# Patient Record
Sex: Male | Born: 1965 | Race: White | Hispanic: No | Smoking: Never smoker
Health system: Southern US, Community
[De-identification: ages and names within clinical notes are randomized; demographics above are authoritative.]

## PROBLEM LIST (undated history)

## (undated) DIAGNOSIS — I509 Heart failure, unspecified: Secondary | ICD-10-CM

## (undated) DIAGNOSIS — E119 Type 2 diabetes mellitus without complications: Secondary | ICD-10-CM

## (undated) DIAGNOSIS — I890 Lymphedema, not elsewhere classified: Secondary | ICD-10-CM

## (undated) DIAGNOSIS — G629 Polyneuropathy, unspecified: Secondary | ICD-10-CM

## (undated) DIAGNOSIS — I10 Essential (primary) hypertension: Secondary | ICD-10-CM

## (undated) DIAGNOSIS — L0390 Cellulitis, unspecified: Secondary | ICD-10-CM

---

## 2009-09-01 ENCOUNTER — Emergency Department (HOSPITAL_COMMUNITY): Admission: EM | Admit: 2009-09-01 | Discharge: 2009-09-01 | Payer: Self-pay | Admitting: Emergency Medicine

## 2009-09-02 ENCOUNTER — Emergency Department (HOSPITAL_COMMUNITY): Admission: EM | Admit: 2009-09-02 | Discharge: 2009-09-02 | Payer: Self-pay | Admitting: Emergency Medicine

## 2011-04-08 ENCOUNTER — Encounter (HOSPITAL_COMMUNITY): Payer: Self-pay

## 2011-04-08 ENCOUNTER — Emergency Department (HOSPITAL_COMMUNITY)
Admission: EM | Admit: 2011-04-08 | Discharge: 2011-04-08 | Disposition: A | Discharge status: Home / Self Care | Payer: Self-pay | Attending: Emergency Medicine | Admitting: Emergency Medicine

## 2011-04-08 ENCOUNTER — Emergency Department (HOSPITAL_COMMUNITY): Payer: Self-pay

## 2011-04-08 DIAGNOSIS — R609 Edema, unspecified: Secondary | ICD-10-CM | POA: Insufficient documentation

## 2011-04-08 DIAGNOSIS — L0390 Cellulitis, unspecified: Secondary | ICD-10-CM

## 2011-04-08 DIAGNOSIS — L02419 Cutaneous abscess of limb, unspecified: Secondary | ICD-10-CM | POA: Insufficient documentation

## 2011-04-08 DIAGNOSIS — R600 Localized edema: Secondary | ICD-10-CM

## 2011-04-08 DIAGNOSIS — S91009A Unspecified open wound, unspecified ankle, initial encounter: Secondary | ICD-10-CM | POA: Insufficient documentation

## 2011-04-08 DIAGNOSIS — S81802A Unspecified open wound, left lower leg, initial encounter: Secondary | ICD-10-CM

## 2011-04-08 DIAGNOSIS — S81009A Unspecified open wound, unspecified knee, initial encounter: Secondary | ICD-10-CM | POA: Insufficient documentation

## 2011-04-08 DIAGNOSIS — IMO0002 Reserved for concepts with insufficient information to code with codable children: Secondary | ICD-10-CM | POA: Insufficient documentation

## 2011-04-08 LAB — CBC
HCT: 45.9 % (ref 39.0–52.0)
Hemoglobin: 15.2 g/dL (ref 13.0–17.0)
MCHC: 33.1 g/dL (ref 30.0–36.0)
MCV: 93.1 fL (ref 78.0–100.0)
Platelets: 226 10*3/uL (ref 150–400)
RDW: 13.3 % (ref 11.5–15.5)
WBC: 9.6 10*3/uL (ref 4.0–10.5)

## 2011-04-08 LAB — DIFFERENTIAL
Basophils Absolute: 0 10*3/uL (ref 0.0–0.1)
Basophils Relative: 0 % (ref 0–1)
Eosinophils Absolute: 0.3 10*3/uL (ref 0.0–0.7)
Eosinophils Relative: 3 % (ref 0–5)
Lymphs Abs: 1.9 10*3/uL (ref 0.7–4.0)
Monocytes Absolute: 0.7 10*3/uL (ref 0.1–1.0)
Monocytes Relative: 8 % (ref 3–12)
Neutro Abs: 6.7 10*3/uL (ref 1.7–7.7)
Neutrophils Relative %: 70 % (ref 43–77)

## 2011-04-08 LAB — COMPREHENSIVE METABOLIC PANEL
ALT: 19 U/L (ref 0–53)
AST: 15 U/L (ref 0–37)
Albumin: 3.6 g/dL (ref 3.5–5.2)
Alkaline Phosphatase: 102 U/L (ref 39–117)
BUN: 12 mg/dL (ref 6–23)
Calcium: 10.1 mg/dL (ref 8.4–10.5)
Chloride: 97 mEq/L (ref 96–112)
Glucose, Bld: 100 mg/dL — ABNORMAL HIGH (ref 70–99)

## 2011-04-08 MED ORDER — DOXYCYCLINE HYCLATE 100 MG PO CAPS: 100.0000 mg | ORAL_CAPSULE | Freq: Two times a day (BID) | ORAL | Status: AC

## 2011-04-08 MED ORDER — FUROSEMIDE 20 MG PO TABS: 20.0000 mg | ORAL_TABLET | Freq: Every day | ORAL | Status: DC

## 2011-04-08 MED ORDER — DOXYCYCLINE HYCLATE 100 MG PO TABS
100.0000 mg | ORAL_TABLET | Freq: Once | ORAL | Status: AC
  Administered 2011-04-08: 100 mg via ORAL
  Filled 2011-04-08: qty 1

## 2011-04-08 NOTE — ED Notes (Signed)
Patient is comfortable still has some concerns and questions. RN Kendal Hymen aware. Patient is sitting in the chair at bedside waiting for discharge paperwork.

## 2011-04-08 NOTE — ED Notes (Signed)
Pt. States swelling X 4 weeks. Wound on lower left leg X3 weeks.  Reports he hit it on the bottom of a tracker. States taking a diuretic X1 week but has stopped. Reports no loss of feeling in legs and ability to walk.  Pulses noted in both legs.

## 2011-04-08 NOTE — ED Provider Notes (Signed)
This chart was scribed for [employer]. Juan Branch, MD by Williemae Natter. The patient was seen in room APA04/AP[facility]04 at 9:00 AM.  CSN: [id number]  Arrival date & time 04/08/11  0830   First MD Initiated Contact with Patient 04/08/11 0831      No chief complaint on file.   (Consider location/radiation/quality/duration/timing/severity/associated sxs/prior treatment) Patient is a 46 y.o. male presenting with leg pain. The history is provided by the patient.  Leg Pain  The incident occurred more than 1 week ago. Injury mechanism: hit leg on tractor. The pain is present in the left leg. The pain is moderate. Associated symptoms comments: Swelling . He reports no foreign bodies present. He has tried nothing for the symptoms.   Juan Freeman is a 46 y.o. male who presents to the Emergency Department complaining of acute onset constant leg swelling for 4 weeks. Pt reported that he cut his leg on the bucket of a tractor 3 weeks ago. Pt was trying to let wound heal on its own.  Pt reports that strength in the affected extremity is normal and that he has never experienced swelling like this before. Swelling has not subsided since onset. No other associated signs or symptoms.   No past medical history on file.  No past surgical history on file.  No family history on file.  History  Substance Use Topics  . Smoking status: Not on file  . Smokeless tobacco: Not on file  . Alcohol Use: Not on file      Review of Systems 10 Systems reviewed and are negative for acute change except as noted in the HPI.  Allergies  Review of patient's allergies indicates not on file.  Home Medications  No current outpatient prescriptions on file.  There were no vitals taken for this visit.  Physical Exam  Nursing note and vitals reviewed. Constitutional: He is oriented to person, place, and time. He appears well-developed and well-nourished.  HENT:  Head: Normocephalic and atraumatic.  Neck: Normal range of  motion. Neck supple.  Cardiovascular: Normal rate, regular rhythm and normal heart sounds.   Pulmonary/Chest: Effort normal. No respiratory distress.       Few fine crackles at bases  Abdominal: Soft. There is no tenderness.       Abdomen is obese  Musculoskeletal: He exhibits edema.       3+ edema of lower extremities up to thigh Lesion on left leg 15x12 cm with scabbing, erythema, weeping, and induration  Dorsalis pedis are present on both feet  Neurological: He is alert and oriented to person, place, and time.  Skin: Skin is warm and dry.  Psychiatric: He has a normal mood and affect. His behavior is normal.    ED Course  Procedures (including critical care time) DIAGNOSTIC STUDIES: Oxygen Saturation is 98% on room air, normal by my interpretation.    COORDINATION OF CARE: Medications - No data to display   Results for orders placed during the hospital encounter of 04/08/11  CBC      Component Value Range   WBC 9.6  4.0 - 10.5 (K/uL)   RBC 4.93  4.22 - 5.81 (MIL/uL)   Hemoglobin 15.2  13.0 - 17.0 (g/dL)   HCT 40.9  81.1 - 91.4 (%)   MCV 93.1  78.0 - 100.0 (fL)   MCH 30.8  26.0 - 34.0 (pg)   MCHC 33.1  30.0 - 36.0 (g/dL)   RDW 78.2  95.6 - 21.3 (%)   Platelets 226  150 - 400 (K/uL)  DIFFERENTIAL      Component Value Range   Neutrophils Relative 70  43 - 77 (%)   Neutro Abs 6.7  1.7 - 7.7 (K/uL)   Lymphocytes Relative 20  12 - 46 (%)   Lymphs Abs 1.9  0.7 - 4.0 (K/uL)   Monocytes Relative 8  3 - 12 (%)   Monocytes Absolute 0.7  0.1 - 1.0 (K/uL)   Eosinophils Relative 3  0 - 5 (%)   Eosinophils Absolute 0.3  0.0 - 0.7 (K/uL)   Basophils Relative 0  0 - 1 (%)   Basophils Absolute 0.0  0.0 - 0.1 (K/uL)  COMPREHENSIVE METABOLIC PANEL      Component Value Range   Sodium 135  135 - 145 (mEq/L)   Potassium 3.9  3.5 - 5.1 (mEq/L)   Chloride 97  96 - 112 (mEq/L)   CO2 28  19 - 32 (mEq/L)   Glucose, Bld 100 (*) 70 - 99 (mg/dL)   BUN 12  6 - 23 (mg/dL)   Creatinine, Ser  3.47  0.50 - 1.35 (mg/dL)   Calcium 42.5  8.4 - 10.5 (mg/dL)   Total Protein 7.7  6.0 - 8.3 (g/dL)   Albumin 3.6  3.5 - 5.2 (g/dL)   AST 15  0 - 37 (U/L)   ALT 19  0 - 53 (U/L)   Alkaline Phosphatase 102  39 - 117 (U/L)   Total Bilirubin 0.5  0.3 - 1.2 (mg/dL)   GFR calc non Af Amer 86 (*) >90 (mL/min)   GFR calc Af Amer >90  >90 (mL/min)  PRO B NATRIURETIC PEPTIDE      Component Value Range   Pro B Natriuretic peptide (BNP) 39.2  0 - 125 (pg/mL)  Dg Chest 2 View  04/08/2011  *RADIOLOGY REPORT*  Clinical Data: Swelling, cough  CHEST - 2 VIEW  Comparison: None.  Findings: Cardiomediastinal silhouette is unremarkable.  No acute infiltrate or pleural effusion.  No pulmonary edema.  Bony thorax is unremarkable.  IMPRESSION: No active disease.  Original Report Authenticated By: Natasha Mead, M.D.   12:53 PM Recheck pt notified of lab results and scans. No pulmonary edema or congestion. Pt ready for discharge and advised to follow up with physical therapy. Prescribed an antibiotic.   MDM  Patient with poorly healing wound to his left lower leg with mild cellulitis. Labs are unremarkable. BNP negative.Reviewed results with patient. Referral has been made to PT for wound care. Initiated antibiotic therapy.Pt stable in ED with no significant deterioration in condition.The patient appears reasonably screened and/or stabilized for discharge and I doubt any other medical condition or other [facility] requiring further screening, evaluation, or treatment in the ED at this time prior to discharge.  I personally performed the services described in this documentation, which was scribed in my presence. The recorded information has been reviewed and considered.   MDM Reviewed: nursing note and vitals Interpretation: labs and x-ray           Nicoletta Dress. Juan Branch, MD 04/08/11 1319

## 2011-04-08 NOTE — Discharge Instructions (Signed)
Continuing your current care of your wound. Take the antibiotic as directed. Use the Lasix for the next 10 days. Physical therapy will be contacting you for an appointment for wound care. If you do not hear from them by Monday afternoon please call the physical therapy department here at [facility] . The telephone number is [phone].   Cellulitis Cellulitis is an infection of the skin and the tissue beneath it. The area is typically red and tender. It is caused by germs (bacteria) (usually staph or strep) that enter the body through cuts or sores. Cellulitis most commonly occurs in the arms or lower legs.  HOME CARE INSTRUCTIONS   If you are given a prescription for medications which kill germs (antibiotics), take as directed until finished.   If the infection is on the arm or leg, keep the limb elevated as able.   Use a warm cloth several times per day to relieve pain and encourage healing.   See your caregiver for recheck of the infected site as directed if problems arise.   Only take over-the-counter or prescription medicines for pain, discomfort, or fever as directed by your caregiver.  SEEK MEDICAL CARE IF:   The area of redness (inflammation) is spreading, there are red streaks coming from the infected site, or if a part of the infection begins to turn dark in color.   The joint or bone underneath the infected skin becomes painful after the skin has healed.   The infection returns in the same or another area after it seems to have gone away.   A boil or bump swells up. This may be an abscess.   New, unexplained problems such as pain or fever develop.  SEEK IMMEDIATE MEDICAL CARE IF:   You have a fever.   You or your child feels drowsy or lethargic.   There is vomiting, diarrhea, or lasting discomfort or feeling ill (malaise) with muscle aches and pains.  MAKE SURE YOU:   Understand these instructions.   Will watch your condition.   Will get help right away if you are not  doing well or get worse.  Document Released: 10/13/2004 Document Revised: 12/23/2010 Document Reviewed: 08/22/2007 [facility] Patient Information 2012 [place], [place].

## 2011-04-08 NOTE — ED Notes (Signed)
Pt has redness and swelling to both legs x 3 weeks. Pt also has 6 in x 4 in area on left lower leg with redness and scabbed area. No drainage noted. Pt states that he hit his left leg on a bucket on a tractor. States that it started healing and then he hit it on a dog house the next week. Pt alert and oriented  X 3. Skin warm and dry. Color pink. Pt lying on stretcher playing games on his phone.

## 2011-04-08 NOTE — ED Notes (Signed)
Patient was given a urinal at this time. Patient states he does not need anything else at this time.

## 2012-07-24 ENCOUNTER — Encounter (HOSPITAL_COMMUNITY): Payer: Self-pay | Admitting: *Deleted

## 2012-07-24 ENCOUNTER — Emergency Department (HOSPITAL_COMMUNITY): Payer: Self-pay

## 2012-07-24 ENCOUNTER — Inpatient Hospital Stay (HOSPITAL_COMMUNITY)
Admission: EM | Admit: 2012-07-24 | Discharge: 2012-08-06 | DRG: 602 | Disposition: A | Discharge status: Home / Self Care | Payer: MEDICAID | Attending: Internal Medicine | Admitting: Internal Medicine

## 2012-07-24 DIAGNOSIS — F3289 Other specified depressive episodes: Secondary | ICD-10-CM | POA: Diagnosis present

## 2012-07-24 DIAGNOSIS — E876 Hypokalemia: Secondary | ICD-10-CM | POA: Diagnosis present

## 2012-07-24 DIAGNOSIS — L02419 Cutaneous abscess of limb, unspecified: Principal | ICD-10-CM | POA: Diagnosis present

## 2012-07-24 DIAGNOSIS — Z91199 Patient's noncompliance with other medical treatment and regimen due to unspecified reason: Secondary | ICD-10-CM

## 2012-07-24 DIAGNOSIS — R739 Hyperglycemia, unspecified: Secondary | ICD-10-CM | POA: Diagnosis present

## 2012-07-24 DIAGNOSIS — R7309 Other abnormal glucose: Secondary | ICD-10-CM

## 2012-07-24 DIAGNOSIS — E871 Hypo-osmolality and hyponatremia: Secondary | ICD-10-CM | POA: Diagnosis present

## 2012-07-24 DIAGNOSIS — E861 Hypovolemia: Secondary | ICD-10-CM | POA: Diagnosis present

## 2012-07-24 DIAGNOSIS — N170 Acute kidney failure with tubular necrosis: Secondary | ICD-10-CM | POA: Diagnosis not present

## 2012-07-24 DIAGNOSIS — R509 Fever, unspecified: Secondary | ICD-10-CM | POA: Diagnosis not present

## 2012-07-24 DIAGNOSIS — E669 Obesity, unspecified: Secondary | ICD-10-CM

## 2012-07-24 DIAGNOSIS — L03119 Cellulitis of unspecified part of limb: Principal | ICD-10-CM | POA: Diagnosis present

## 2012-07-24 DIAGNOSIS — N179 Acute kidney failure, unspecified: Secondary | ICD-10-CM

## 2012-07-24 DIAGNOSIS — D649 Anemia, unspecified: Secondary | ICD-10-CM | POA: Diagnosis present

## 2012-07-24 DIAGNOSIS — I10 Essential (primary) hypertension: Secondary | ICD-10-CM

## 2012-07-24 DIAGNOSIS — IMO0001 Reserved for inherently not codable concepts without codable children: Secondary | ICD-10-CM | POA: Diagnosis present

## 2012-07-24 DIAGNOSIS — Z6841 Body Mass Index (BMI) 40.0 and over, adult: Secondary | ICD-10-CM

## 2012-07-24 DIAGNOSIS — F329 Major depressive disorder, single episode, unspecified: Secondary | ICD-10-CM | POA: Diagnosis present

## 2012-07-24 DIAGNOSIS — Z9119 Patient's noncompliance with other medical treatment and regimen: Secondary | ICD-10-CM | POA: Diagnosis present

## 2012-07-24 DIAGNOSIS — L0390 Cellulitis, unspecified: Secondary | ICD-10-CM

## 2012-07-24 DIAGNOSIS — R600 Localized edema: Secondary | ICD-10-CM

## 2012-07-24 DIAGNOSIS — E119 Type 2 diabetes mellitus without complications: Secondary | ICD-10-CM

## 2012-07-24 DIAGNOSIS — I5189 Other ill-defined heart diseases: Secondary | ICD-10-CM

## 2012-07-24 HISTORY — DX: Essential (primary) hypertension: I10

## 2012-07-24 LAB — CBC WITH DIFFERENTIAL/PLATELET
Eosinophils Absolute: 0.3 10*3/uL (ref 0.0–0.7)
HCT: 38.5 % — ABNORMAL LOW (ref 39.0–52.0)
Hemoglobin: 13.2 g/dL (ref 13.0–17.0)
Lymphocytes Relative: 20 % (ref 12–46)
Monocytes Absolute: 0.6 10*3/uL (ref 0.1–1.0)

## 2012-07-24 LAB — LIPID PANEL
Cholesterol: 179 mg/dL (ref 0–200)
HDL: 44 mg/dL (ref >39)
LDL Cholesterol: 60 mg/dL (ref 0–99)
Triglycerides: 375 mg/dL — ABNORMAL HIGH (ref <150)

## 2012-07-24 LAB — COMPREHENSIVE METABOLIC PANEL
AST: 19 U/L (ref 0–37)
Alkaline Phosphatase: 130 U/L — ABNORMAL HIGH (ref 39–117)
BUN: 11 mg/dL (ref 6–23)
Calcium: 9.1 mg/dL (ref 8.4–10.5)
Creatinine, Ser: 0.88 mg/dL (ref 0.50–1.35)
GFR calc Af Amer: >90 mL/min (ref >90)
GFR calc non Af Amer: >90 mL/min (ref >90)
Glucose, Bld: 323 mg/dL — ABNORMAL HIGH (ref 70–99)
Total Bilirubin: 0.3 mg/dL (ref 0.3–1.2)
Total Protein: 7.4 g/dL (ref 6.0–8.3)

## 2012-07-24 LAB — GLUCOSE, CAPILLARY: Glucose-Capillary: 166 mg/dL — ABNORMAL HIGH (ref 70–99)

## 2012-07-24 MED ORDER — ONDANSETRON HCL 4 MG/2ML IJ SOLN: 4.0000 mg | Freq: Three times a day (TID) | INTRAMUSCULAR | Status: AC | PRN

## 2012-07-24 MED ORDER — CLINDAMYCIN PHOSPHATE 900 MG/50ML IV SOLN
INTRAVENOUS | Status: AC
  Administered 2012-07-24: 900 mg
  Filled 2012-07-24: qty 50

## 2012-07-24 MED ORDER — SODIUM CHLORIDE 0.9 % IJ SOLN
3.0000 mL | Freq: Two times a day (BID) | INTRAMUSCULAR | Status: DC
  Administered 2012-07-24 – 2012-08-02 (×6): 3 mL via INTRAVENOUS

## 2012-07-24 MED ORDER — ACETAMINOPHEN 325 MG PO TABS
650.0000 mg | ORAL_TABLET | Freq: Four times a day (QID) | ORAL | Status: DC | PRN
  Administered 2012-07-26 – 2012-08-03 (×9): 650 mg via ORAL
  Filled 2012-07-24 (×9): qty 2

## 2012-07-24 MED ORDER — ENOXAPARIN SODIUM 40 MG/0.4ML ~~LOC~~ SOLN
40.0000 mg | SUBCUTANEOUS | Status: DC
  Administered 2012-07-24 – 2012-08-05 (×13): 40 mg via SUBCUTANEOUS
  Filled 2012-07-24 (×13): qty 0.4

## 2012-07-24 MED ORDER — INSULIN ASPART 100 UNIT/ML ~~LOC~~ SOLN
0.0000 [IU] | Freq: Three times a day (TID) | SUBCUTANEOUS | Status: DC
  Administered 2012-07-24: 3 [IU] via SUBCUTANEOUS
  Administered 2012-07-25: 5 [IU] via SUBCUTANEOUS

## 2012-07-24 MED ORDER — LISINOPRIL 5 MG PO TABS: 5.0000 mg | ORAL_TABLET | Freq: Every day | ORAL | Status: DC

## 2012-07-24 MED ORDER — CLINDAMYCIN PHOSPHATE 900 MG/6ML IV SOLN: 900.0000 mg | Freq: Once | INTRAVENOUS | Status: DC

## 2012-07-24 MED ORDER — ACETAMINOPHEN 650 MG RE SUPP: 650.0000 mg | Freq: Four times a day (QID) | RECTAL | Status: DC | PRN

## 2012-07-24 MED ORDER — SODIUM CHLORIDE 0.9 % IV SOLN
INTRAVENOUS | Status: AC
  Administered 2012-07-25 (×2): via INTRAVENOUS

## 2012-07-24 MED ORDER — HYDRALAZINE HCL 25 MG PO TABS
25.0000 mg | ORAL_TABLET | Freq: Four times a day (QID) | ORAL | Status: DC | PRN
  Administered 2012-07-24: 25 mg via ORAL
  Filled 2012-07-24: qty 1

## 2012-07-24 MED ORDER — CLINDAMYCIN PHOSPHATE 900 MG/50ML IV SOLN: 900.0000 mg | Freq: Once | INTRAVENOUS | Status: AC

## 2012-07-24 MED ORDER — CLINDAMYCIN PHOSPHATE 600 MG/50ML IV SOLN
600.0000 mg | Freq: Three times a day (TID) | INTRAVENOUS | Status: DC
  Administered 2012-07-24 – 2012-07-25 (×2): 600 mg via INTRAVENOUS
  Filled 2012-07-24 (×3): qty 50

## 2012-07-24 MED ORDER — CLINDAMYCIN PHOSPHATE 600 MG/50ML IV SOLN: 600.0000 mg | Freq: Three times a day (TID) | INTRAVENOUS | Status: DC

## 2012-07-24 MED ORDER — LISINOPRIL 10 MG PO TABS
10.0000 mg | ORAL_TABLET | Freq: Every day | ORAL | Status: DC
  Administered 2012-07-24 – 2012-07-26 (×3): 10 mg via ORAL
  Filled 2012-07-24 (×3): qty 1

## 2012-07-24 MED ORDER — AMLODIPINE BESYLATE 5 MG PO TABS
10.0000 mg | ORAL_TABLET | Freq: Every day | ORAL | Status: DC
  Administered 2012-07-24 – 2012-08-06 (×14): 10 mg via ORAL
  Filled 2012-07-24 (×6): qty 2
  Filled 2012-07-24: qty 1
  Filled 2012-07-24: qty 2
  Filled 2012-07-24: qty 1
  Filled 2012-07-24 (×6): qty 2

## 2012-07-24 NOTE — ED Notes (Signed)
Bilateral LE swelling and weeping.  Several months ago treated for cellulitis, treated with doxycycline.  Has had symptoms "of congestive heart failure", but has changed diet and lost weight which has helped these symptoms.

## 2012-07-24 NOTE — ED Provider Notes (Signed)
History    CSN: [id number] Arrival date & time 07/24/12  1304  First MD Initiated Contact with Patient 07/24/12 1422     Chief Complaint  Patient presents with  . Leg Swelling  . Cellulitis   (Consider location/radiation/quality/duration/timing/severity/associated sxs/prior Treatment) HPI Comments: KAYDIN KARBOWSKI is a 47 y.o. male who states that his legs are swelling and draining for one week. The left is worse than the right. He is able to continue working. His boss wanted him to see a doctor before he returned to work. He denies associated fever, chills, nausea, vomiting, weakness, or dizziness. In a similar episode one year ago that was treated with doxycycline. He is not currently have a primary care doctor. He does not currently take any medications. His history of untreated diabetes and hypertension. There are no other known modifying factors.  The history is provided by the patient.   Past Medical History  Diagnosis Date  . Hypertension    History reviewed. No pertinent past surgical history. Family History  Problem Relation Age of Onset  . Diabetes Mother   . Cancer Father    History  Substance Use Topics  . Smoking status: Never Smoker   . Smokeless tobacco: Not on file  . Alcohol Use: No    Review of Systems  All other systems reviewed and are negative.    Allergies  Review of patient's allergies indicates no known allergies.  Home Medications   Current Outpatient Rx  Name  Route  Sig  Dispense  Refill  . ibuprofen (ADVIL,MOTRIN) 200 MG tablet   Oral   Take 200 mg by mouth every 6 (six) hours as needed for pain.          BP 167/116  Pulse 108  Temp(Src) 98.4 F (36.9 C) (Oral)  Resp 20  Ht 5\' 8"  (1.727 m)  Wt 290 lb (131.543 kg)  BMI 44.1 kg/m2  SpO2 100% Physical Exam  Nursing note and vitals reviewed. Constitutional: He is oriented to person, place, and time. He appears well-developed.  Obese. Poor hygiene  HENT:  Head: Normocephalic  and atraumatic.  Right Ear: External ear normal.  Left Ear: External ear normal.  Eyes: Conjunctivae and EOM are normal. Pupils are equal, round, and reactive to light.  Neck: Normal range of motion and phonation normal. Neck supple.  Cardiovascular: Normal rate, regular rhythm, normal heart sounds and intact distal pulses.   Pulmonary/Chest: Effort normal and breath sounds normal. He exhibits no bony tenderness.  Abdominal: Soft. Normal appearance. There is no tenderness.  Musculoskeletal: Normal range of motion.  3+ pitting edema bilaterally. Redness, lower legs, bilaterally, sparing the feet. No associated abscess. See skin exam. The legs are nontender. No apparent portal for infection on the feet.  Neurological: He is alert and oriented to person, place, and time. He has normal strength. No cranial nerve deficit or sensory deficit. He exhibits normal muscle tone. Coordination normal.  Skin: Skin is warm, dry and intact.  Reddened lower legs associated with the swelling, bilaterally. There areas of open wounds, with drainage and rarely on the left lower leg. The drainage is clear. The wounds are superficial epidermal. Toenails are not maintained.  Psychiatric: He has a normal mood and affect. His behavior is normal. Judgment and thought content normal.    ED Course  Procedures (including critical care time) Medications  clindamycin (CLEOCIN) IVPB 900 mg (0 mg Intravenous Duplicate 07/24/12 1525)  clindamycin (CLEOCIN) 900 MG/50ML IVPB (0 mg  Stopped  07/24/12 1529)    Patient Vitals for the past 24 hrs:  BP Temp Temp src Pulse Resp SpO2 Height Weight  07/24/12 1306 167/116 mmHg 98.4 F (36.9 C) Oral 108 20 100 % 5\' 8"  (1.727 m) 290 lb (131.543 kg)       Date: 07/24/12  Rate: 90  Rhythm: normal sinus rhythm  QRS Axis: right  PR and QT Intervals: normal  ST/T Wave abnormalities: nonspecific ST/T changes  PR and QRS Conduction Disutrbances:none  Narrative Interpretation:   Old EKG  Reviewed: none available  3:42 PM-Consult complete with Hospitalist, Dr. Lafe Garin . Patient case explained and discussed. He agrees to admit patient for further evaluation and treatment. Call ended at 1608  Labs Reviewed  CBC WITH DIFFERENTIAL - Abnormal; Notable for the following:    HCT 38.5 (*)    All other components within normal limits  COMPREHENSIVE METABOLIC PANEL - Abnormal; Notable for the following:    Sodium 134 (*)    Glucose, Bld 323 (*)    Albumin 3.2 (*)    Alkaline Phosphatase 130 (*)    All other components within normal limits  PRO B NATRIURETIC PEPTIDE   Dg Chest Portable 1 View  07/24/2012   *RADIOLOGY REPORT*  Clinical Data: Leg swelling and cellulitis  PORTABLE CHEST - 1 VIEW  Comparison: 04/08/2011  Findings: Heart size is normal.  There is no pleural effusion identified.  No airspace consolidation.  Atelectasis is noted in the left base.  IMPRESSION:  1.  Left base atelectasis.   Original Report Authenticated By: Signa Kell, M.D.   1. Cellulitis   2. Diabetes mellitus   3. History of noncompliance with medical treatment     MDM  Cellulitis, recurrent, with hyperglycemia and hypertension. Patient has previously been diagnosed with hypertension and diabetes, is not being treated for them, currently. He does not appear septic. I doubt bacteremia. He'll need to be admitted for stabilization.   Plan: Admit  Flint Melter, MD 07/24/12 [phone]

## 2012-07-24 NOTE — ED Notes (Signed)
Attempted to call report, nurse unavailable.

## 2012-07-24 NOTE — H&P (Signed)
[facility] History and Physical  Juan Freeman:[id number] DOB: July 27, 1965 DOA: 07/24/2012  Referring physician: Dr. Effie Shy PCP: No PCP Per Patient  Specialists: none  Chief Complaint: leg swelling  HPI: Juan Freeman is a 47 y.o. male without known medical history mainly because he is not regularly seen by a doctor comes in with worsening bilateral LE left worse than right swelling, redness and weeping/drainage mainly from left lower extremity. 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 Denies systemic symptoms such as fever/chills/lightheadedness. Denies chest pain/breathing difficulties. Denies nausea/vomiting diarrhea. He thinks he has heart failure because of the leg swelling but denies ever having had a 2D echo. He reads a lot on the Internet about various medical conditions and is generally medically aware. Endorses intentional weight loss by giving up sodas.   Review of Systems: as per HPI otherwise negative.   Past Medical History  Diagnosis Date  . Hypertension    History reviewed. No pertinent past surgical history. Social History:  reports that he has never smoked. He does not have any smokeless tobacco history on file. He reports that he does not drink alcohol or use illicit drugs.  No Known Allergies  Family History  Problem Relation Age of Onset  . Diabetes Mother   . Cancer Father    Prior to Admission medications   Medication Sig Start Date End Date Taking? Authorizing Provider  ibuprofen (ADVIL,MOTRIN) 200 MG tablet Take 200 mg by mouth every 6 (six) hours as needed for pain.   Yes Historical Provider, MD   Physical Exam: Filed Vitals:   07/24/12 1306 07/24/12 1607  BP: 167/116 158/87  Pulse: 108 87  Temp: 98.4 F (36.9 C) 98.5 F (36.9 C)  TempSrc: Oral Oral  Resp: 20 20  Height: 5\' 8"  (1.727 m)   Weight: 131.543 kg (290 lb)   SpO2: 100% 97%     General:  No apparent distress,  very talkative  Eyes: PERRL, EOMI, no scleral icterus  ENT: moist oropharynx  Neck: supple, no JVD  Cardiovascular: regular rate without MRG; 2+ peripheral pulses  Respiratory: CTA biL, good air movement without wheezing, rhonchi or crackled  Abdomen: obese, soft, non tender to palpation, positive bowel sounds, no guarding, no rebound  Skin: no rashes  Musculoskeletal: 2+ bilateral LE edema, bilateral swelling, worse on left, with areas of open wounds, weeping clear fluid. Poor foot hygiene.   Psychiatric: normal mood and affect  Neurologic: CN 2-12 grossly intact, MS 5/5 in all 4  Labs on Admission:  Basic Metabolic Panel:  Recent Labs Lab 07/24/12 1429  NA 134*  K 3.5  CL 99  CO2 24  GLUCOSE 323*  BUN 11  CREATININE 0.88  CALCIUM 9.1   Liver Function Tests:  Recent Labs Lab 07/24/12 1429  AST 19  ALT 26  ALKPHOS 130*  BILITOT 0.3  PROT 7.4  ALBUMIN 3.2*   CBC:  Recent Labs Lab 07/24/12 1429  WBC 8.8  NEUTROABS 6.2  HGB 13.2  HCT 38.5*  MCV 91.0  PLT 258   BNP (last 3 results)  Recent Labs  07/24/12 1429  PROBNP 28.5   Radiological Exams on Admission: Dg Chest Portable 1 View  07/24/2012   *RADIOLOGY REPORT*  Clinical Data: Leg swelling and cellulitis  PORTABLE CHEST - 1 VIEW  Comparison: 04/08/2011  Findings: Heart size is normal.  There is no pleural effusion identified.  No airspace consolidation.  Atelectasis is noted in the left base.  IMPRESSION:  1.  Left base atelectasis.   Original Report Authenticated By: Signa Kell, M.D.    EKG: Independently reviewed.  Assessment/Plan Active Problems:   Cellulitis   Hypertension   Obesity   Hyperglycemia  Cellulitis - iv Clindamycin. Monitor and transition to po as indicated.  HTN - hypertensive in the ED. Start low dose Lisnopril Hyperglycemia - suspect underlying DM. Check HBA1C. SSI Obesity - counseled about weight loss. DVT Prophylaxis - Lovenox.  Code Status: Full, presumed   Family Communication: none  Disposition Plan: inpatient, home when medically ready  Time spent: 30  Costin M. Elvera Lennox, MD [facility] Pager [phone]  If 7PM-7AM, please contact night-coverage [email or web address] Password [facility] 07/24/2012, 4:44 PM

## 2012-07-24 NOTE — ED Notes (Signed)
Bilateral lower extremities with erythema and edema left>right.  Left leg appears to have sloughing of skin.  The patient states that he has been treated with doxycycline in the past for his leg problems.  States that he has been reading on the Internet and states he has congestive heart failure.  The patient is hesitant about treatment.  The patient has poor hygiene.  Bilateral feet with blackened long toe nails.

## 2012-07-25 DIAGNOSIS — I517 Cardiomegaly: Secondary | ICD-10-CM

## 2012-07-25 LAB — CBC
HCT: 39.5 % (ref 39.0–52.0)
MCH: 31 pg (ref 26.0–34.0)
MCHC: 33.9 g/dL (ref 30.0–36.0)
MCV: 91.4 fL (ref 78.0–100.0)
RDW: 13.8 % (ref 11.5–15.5)

## 2012-07-25 LAB — GLUCOSE, CAPILLARY: Glucose-Capillary: 204 mg/dL — ABNORMAL HIGH (ref 70–99)

## 2012-07-25 LAB — BASIC METABOLIC PANEL
BUN: 12 mg/dL (ref 6–23)
Calcium: 9 mg/dL (ref 8.4–10.5)
Creatinine, Ser: 0.87 mg/dL (ref 0.50–1.35)
GFR calc Af Amer: >90 mL/min (ref >90)
GFR calc non Af Amer: >90 mL/min (ref >90)
Glucose, Bld: 207 mg/dL — ABNORMAL HIGH (ref 70–99)
Potassium: 3.8 mEq/L (ref 3.5–5.1)

## 2012-07-25 LAB — HEMOGLOBIN A1C: Hgb A1c MFr Bld: 8.3 % — ABNORMAL HIGH (ref <5.7)

## 2012-07-25 MED ORDER — VANCOMYCIN HCL IN DEXTROSE 1-5 GM/200ML-% IV SOLN
1000.0000 mg | Freq: Three times a day (TID) | INTRAVENOUS | Status: DC
  Administered 2012-07-25 – 2012-07-26 (×4): 1000 mg via INTRAVENOUS
  Filled 2012-07-25 (×6): qty 200

## 2012-07-25 MED ORDER — BD GETTING STARTED TAKE HOME KIT: 1/2ML X 30G SYRINGES
1.0000 | Freq: Once | Status: AC
  Administered 2012-07-25: 1
  Filled 2012-07-25: qty 1

## 2012-07-25 MED ORDER — INSULIN ASPART 100 UNIT/ML ~~LOC~~ SOLN: 0.0000 [IU] | Freq: Every day | SUBCUTANEOUS | Status: DC

## 2012-07-25 MED ORDER — METFORMIN HCL 500 MG PO TABS
500.0000 mg | ORAL_TABLET | Freq: Two times a day (BID) | ORAL | Status: DC
  Administered 2012-07-25 – 2012-07-26 (×4): 500 mg via ORAL
  Filled 2012-07-25 (×4): qty 1

## 2012-07-25 MED ORDER — SODIUM CHLORIDE 0.9 % IV SOLN
2000.0000 mg | Freq: Once | INTRAVENOUS | Status: AC
  Administered 2012-07-25: 2000 mg via INTRAVENOUS
  Filled 2012-07-25 (×2): qty 2000

## 2012-07-25 MED ORDER — INSULIN ASPART 100 UNIT/ML ~~LOC~~ SOLN
0.0000 [IU] | Freq: Three times a day (TID) | SUBCUTANEOUS | Status: DC
  Administered 2012-07-25: 7 [IU] via SUBCUTANEOUS
  Administered 2012-07-25: 3 [IU] via SUBCUTANEOUS
  Administered 2012-07-26 (×3): 4 [IU] via SUBCUTANEOUS
  Administered 2012-07-27: 3 [IU] via SUBCUTANEOUS
  Administered 2012-07-27: 4 [IU] via SUBCUTANEOUS
  Administered 2012-07-27: 3 [IU] via SUBCUTANEOUS
  Administered 2012-07-28: 4 [IU] via SUBCUTANEOUS
  Administered 2012-07-29 – 2012-08-03 (×5): 3 [IU] via SUBCUTANEOUS
  Administered 2012-08-06: 4 [IU] via SUBCUTANEOUS

## 2012-07-25 MED ORDER — PIPERACILLIN-TAZOBACTAM 3.375 G IVPB
3.3750 g | Freq: Three times a day (TID) | INTRAVENOUS | Status: DC
  Administered 2012-07-25 – 2012-08-02 (×25): 3.375 g via INTRAVENOUS
  Filled 2012-07-25 (×37): qty 50

## 2012-07-25 MED ORDER — LIVING WELL WITH DIABETES BOOK
Freq: Once | Status: AC
  Administered 2012-07-25: 11:00:00
  Filled 2012-07-25: qty 1

## 2012-07-25 MED ORDER — INSULIN DETEMIR 100 UNIT/ML ~~LOC~~ SOLN
10.0000 [IU] | Freq: Every day | SUBCUTANEOUS | Status: DC
  Administered 2012-07-25 – 2012-08-05 (×11): 10 [IU] via SUBCUTANEOUS
  Filled 2012-07-25 (×13): qty 0.1

## 2012-07-25 NOTE — Progress Notes (Signed)
Inpatient Diabetes Program Recommendations  AACE/ADA: New Consensus Statement on Inpatient Glycemic Control (2013)  Target Ranges:  Prepandial:   less than 140 mg/dL      Peak postprandial:   less than 180 mg/dL (1-2 hours)      Critically ill patients:  140 - 180 mg/dL   Reason for Visit:  New onset Diabetes  Note: Spoke with pt about new onset diabetes diagnosis. Discussed A1C results (8.3%) with him and explained what an A1C is, basic pathophysiology of DM Type 2, basic home care, importance of checking CBGs and maintaining good CBG control to prevent long-term and short-term complications. Reviewed signs and symptoms of hyperglycemia and hypoglycemia along with treatment for both.  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  He reports that he is knowledgeable on various types of insulin and how to administer insulin since he gave his mother insulin injections of Lantus and Novolog via vial/syringe and via insulin pens.  Demonstrated proper technique for both vial/syringe and insulin pen and patient is able to teach back information taught.  Patient reports that he prefers to use insulin pens because "the needle is smaller and they hurt less".  Informed the patient that a box of insulin pens would be over $400 per box without any insurance.  Discussed more affordable insulins and informed the patient that insulins such as Novolin R, Novolin N, and Novolin 70/30 could be purchased at [employer] for $25 a vial.  After discussing cost, patient is agreeable to use Novolin (generic Reli-On insulins from [employer]).  Patient requested that he use whichever insulins would allow him to take as few injections per day as possible.  Therefore, may want to change basal insulin to 70/30 as an inpatient to determine effect on glycemic control.  The equivalent of Levemir 10 units QHS to 70/30 would be 70/30 9 units BID.  Have consulted Case Manager for  medication assistance and to arrange follow up.  Reviewed Living Well with Diabetes booklet with the patient and discussed impact of exercise, diet, stress, and sickness on glucose control.  Provided patient with information on Carb Counting and Meal Planning.  RD has already visited with the patient and provided additional material on diabetic diet.  Bedside RNs to provide ongoing basic DM education at bedside with patient. Have ordered insulin starter kit, and DM videos.  Patient verbalizes understanding of information discussed and states that he has not further questions at this time related to diabetes.  Will continue to follow as an inpatient.  Thanks, Orlando Penner, RN, MSN, CCRN Diabetes Coordinator Inpatient Diabetes Program [phone]

## 2012-07-25 NOTE — Progress Notes (Signed)
INITIAL NUTRITION ASSESSMENT  DOCUMENTATION CODES Per approved criteria  -Extereme Obesity Class III   INTERVENTION:  ProStat 30 ml TID (each 30 ml provides 100 kcal, 15 gr protein) due to increased protein needs  Diabetes diet education provided. Recommend pt follow-up with outpatient RD for additional reinforcement of DM diet principles and monitoring for weight loss  NUTRITION DIAGNOSIS:  Nutrition knowledge deficit related to diabetic diet guidelines as evidenced by new DM diagnosis and Hgb A1C of 8.3%.  Goal: Pt will comply with carbohydrate modified diet and meet > 90% of estimated protein-energy needs   Monitor:  Po intake, labs, wt trends, skin assessments and diet compliance  Reason for Assessment: Malnutrition Screen Score = 3 , Consult to provide DM diet education  47 y.o. male  Admitting Dx: <principal problem not specified>  ASSESSMENT:  RD consulted for nutrition education regarding diabetes. He lives alone and eats out for most of his meals. Works night shift. He likes to walk for exercise. His has experienced wt loss recently 15# likely related to new diabetes diagnosis and increased protein-energy needs with cellulitis. Will add protein modular to help meet protein goals.  Lab Results  Component Value Date   HGBA1C 8.3* 07/24/2012    RD provided "Carbohydrate Counting for People with Diabetes" handout. Discussed different food groups and their effects on blood sugar, emphasizing carbohydrate-containing foods. Provided list of carbohydrates and recommended serving sizes of common foods.  Discussed importance of controlled and consistent carbohydrate intake throughout the day. Provided examples of ways to balance meals/snacks and encouraged intake of high-fiber, whole grain complex carbohydrates. Teach back method used.  Expect fair compliance.  Height: Ht Readings from Last 1 Encounters:  07/24/12 5\' 8"  (1.727 m)    Weight: Wt Readings from Last 1  Encounters:  07/24/12 284 lb 13.4 oz (129.2 kg)    Ideal Body Weight: 154# (70 kg)  % Ideal Body Weight: 185%  Wt Readings from Last 10 Encounters:  07/24/12 284 lb 13.4 oz (129.2 kg)    Usual Body Weight: 300#  % Usual Body Weight: 95%  BMI:  Body mass index is 43.32 kg/(m^2). Extreme Obesity Class III  Estimated Nutritional Needs: Kcal: 1800-2000 Protein: 105-126 gr Fluid: > 2000 ml/day  Skin: cellulitis, bilateral LE  Diet Order: Carb Control  EDUCATION NEEDS: -Education needs addressed   Intake/Output Summary (Last 24 hours) at 07/25/12 1209 Last data filed at 07/25/12 0551  Gross per 24 hour  Intake   1600 ml  Output      0 ml  Net   1600 ml    Last BM: 07/23/12  Labs:   Recent Labs Lab 07/24/12 1429 07/25/12 0530  NA 134* 133*  K 3.5 3.8  CL 99 100  CO2 24 21  BUN 11 12  CREATININE 0.88 0.87  CALCIUM 9.1 9.0  GLUCOSE 323* 207*    CBG (last 3)   Recent Labs  07/24/12 2119 07/25/12 0723 07/25/12 1117  GLUCAP 166* 237* 204*    Scheduled Meds: . [COMPLETED] sodium chloride   Intravenous STAT  . amLODipine  10 mg Oral Daily  . enoxaparin (LOVENOX) injection  40 mg Subcutaneous Q24H  . insulin aspart  0-20 Units Subcutaneous TID WC  . insulin aspart  0-5 Units Subcutaneous QHS  . insulin detemir  10 Units Subcutaneous QHS  . lisinopril  10 mg Oral Daily  . living well with diabetes book   Does not apply Once  . metFORMIN  500 mg Oral  BID WC  . piperacillin-tazobactam (ZOSYN)  IV  3.375 g Intravenous Q8H  . sodium chloride  3 mL Intravenous Q12H  . vancomycin  2,000 mg Intravenous Once  . vancomycin  1,000 mg Intravenous Q8H    Continuous Infusions:   Past Medical History  Diagnosis Date  . Hypertension     History reviewed. No pertinent past surgical history.  Royann Shivers MS,RD,LDN,CSG Office: [phone] Pager: [phone]

## 2012-07-25 NOTE — Progress Notes (Addendum)
  Juan Freeman:[id number] DOB: [birth year] DOA: 07/24/2012 PCP: No PCP Per Patient   Subjective: This man was admitted with bilateral leg cellulitis and found to be diagnosed with diabetes and probably hypertension. He has been started on sliding scale of insulin and oral antihypertensive medications as well as intravenous antibiotics. His cellulitis, according to him, has not improved significantly.           Physical Exam: Blood pressure 117/68, pulse 78, temperature 98 F (36.7 C), temperature source Oral, resp. rate 20, height 5\' 8"  (1.727 m), weight 129.2 kg (284 lb 13.4 oz), SpO2 100.00%. He looks systemically well. Is not toxic or septic. Heart sounds are present without murmurs or sounds. Lung fields are clear. He has bilateral lower leg cellulitis. Both his lower legs are bandaged, he says there is raw skin underneath. He is alert and orientated.   Investigations:  No results found for this or any previous visit (from the past 240 hour(s)).   Basic Metabolic Panel:  Recent Labs  81/19/14 1429 07/25/12 0530  NA 134* 133*  K 3.5 3.8  CL 99 100  CO2 24 21  GLUCOSE 323* 207*  BUN 11 12  CREATININE 0.88 0.87  CALCIUM 9.1 9.0   Liver Function Tests:  Recent Labs  07/24/12 1429  AST 19  ALT 26  ALKPHOS 130*  BILITOT 0.3  PROT 7.4  ALBUMIN 3.2*     CBC:  Recent Labs  07/24/12 1429 07/25/12 0530  WBC 8.8 11.1*  NEUTROABS 6.2  --   HGB 13.2 13.4  HCT 38.5* 39.5  MCV 91.0 91.4  PLT 258 172    Dg Chest Portable 1 View  07/24/2012   *RADIOLOGY REPORT*  Clinical Data: Leg swelling and cellulitis  PORTABLE CHEST - 1 VIEW  Comparison: 04/08/2011  Findings: Heart size is normal.  There is no pleural effusion identified.  No airspace consolidation.  Atelectasis is noted in the left base.  IMPRESSION:  1.  Left base atelectasis.   Original Report Authenticated By: Signa Kell, M.D.      Medications: I have reviewed the patient's current  medications.  Impression: 1. Bilateral leg cellulitis, lower legs. Very poor foot hygiene. 2. Newly diagnosed uncontrolled type 2 diabetes mellitus. 3. Hypertension. 4. Obesity.     Plan: 1. Change intravenous antibiotics to a combination of Zosyn and vancomycin. 2. Start metformin 500 mg twice a day with sliding scale of insulin and basal Levemir insulin at night. Diabetic education. 3. Wound care consult.  Consultants:  Wound care consult pending.   Procedures:  Echocardiogram, report pending.   Antibiotics:  Intravenous Zosyn started 07/25/2012.  Intravenous vancomycin started 07/25/2012.                   Code Status: Full code.  Family Communication: Discussed plan with patient at the bedside.   Disposition Plan: Home when medically stable.  Time spent: 20 minutes.   LOS: 1 day   Wilson Singer Pager [phone]  07/25/2012, 10:47 AM

## 2012-07-25 NOTE — Progress Notes (Signed)
*  PRELIMINARY RESULTS* Echocardiogram 2D Echocardiogram has been performed.  Juan Freeman 07/25/2012, 9:41 AM

## 2012-07-25 NOTE — Progress Notes (Signed)
Inpatient Diabetes Program Recommendations  AACE/ADA: New Consensus Statement on Inpatient Glycemic Control (2013)  Target Ranges:  Prepandial:   less than 140 mg/dL      Peak postprandial:   less than 180 mg/dL (1-2 hours)      Critically ill patients:  140 - 180 mg/dL   Results for Juan Freeman, Juan Freeman (MRN [id number]) as of 07/25/2012 08:40  Ref. Range 07/24/2012 14:29 07/25/2012 05:30  Hemoglobin A1C Latest Range: <5.7 % 8.3 (H)   Glucose Latest Range: 70-99 mg/dL 409 (H) 811 (H)  Results for Juan Freeman, Juan Freeman (MRN [id number]) as of 07/25/2012 08:40  Ref. Range 07/24/2012 18:34 07/24/2012 21:19 07/25/2012 07:23  Glucose-Capillary Latest Range: 70-99 mg/dL 213 (H) 086 (H) 578 (H)    Inpatient Diabetes Program Recommendations Correction (SSI): Please consider increasing Novolog correction to resistant scale and adding Novolog bedtime correction.  Note: Patient does not have a prior history of diabetes.  However, initial blood glucose noted to be 323 mg/dl on 04/23/94 @ 29:52 and W4X was 8.3% on 07/24/12.  According to the ADA criteria, patient meets criteria for diabetes diagnosis.  MD - Please inform patient of new diagnosis and instruct on outpatient diabetes management plan (orals or insulin; noted that patient has no insurance so need to be mindful of cost of outpatient medications) so that patient can be educated.  Patient will also need dietician consult for diabetic nutrition education.  While inpatient, please consider increasing Novolog correction to resistant scale and adding Novolog bedtime correction. Will continue to follow.  Thanks, Orlando Penner, RN, MSN, CCRN Diabetes Coordinator Inpatient Diabetes Program [phone]

## 2012-07-25 NOTE — Care Management Note (Signed)
    Page 1 of 2   08/06/2012     2:58:34 PM   CARE MANAGEMENT NOTE 08/06/2012  Patient:  Juan Freeman, Juan Freeman   Account Number:  [id number]  Date Initiated:  07/25/2012  Documentation initiated by:  Rosemary Holms  Subjective/Objective Assessment:   Pt admitted from home where he lives alone. Pt connected for Kathie Rhodes, Artist. Pt states he understands DM since he took care of his mother. Concern about knowledge. CM spoke to Hilda Lias, diabetic coordinator regarding pt's needs.     Action/Plan:   Need PCP   Anticipated DC Date:  08/07/2012   Anticipated DC Plan:  HOME/SELF CARE      DC Planning Services  CM consult      Choice offered to / List presented to:             Status of service:  Completed, signed off Medicare Important Message given?   (If response is "NO", the following Medicare IM given date fields will be blank) Date Medicare IM given:   Date Additional Medicare IM given:    Discharge Disposition:    Per UR Regulation:    If discussed at Long Length of Stay Meetings, dates discussed:   07/31/2012    Comments:  08/06/12 Rosemary Holms RN BSN CM Pt declined [facility] services.  08/02/12 Vence Lalor Leanord Hawking RN BSN CM Spoke to pt who now is in agreement to set up a PCP. [facility] contacted. Eligibility appt made then followed by an appt with their NP. DC instructions states details. Per Marylene Land at the clinic, Dr. August Saucer is the MD over the NP.  07/30/12 1400 Venetia Prewitt RN BSN CM Pt slow to improve. CM continuing to follow  07/25/12 14:45 Jaece Ducharme Leanord Hawking RN BSN CM Pt set up with [facility] for 08/09/12 @ 9:00 am, to arrive 10 min early

## 2012-07-25 NOTE — Progress Notes (Signed)
ANTIBIOTIC CONSULT NOTE - INITIAL  Pharmacy Consult for Vancomycin and Zosyn Indication: bilateral LE cellulitis (L > R)  No Known Allergies  Patient Measurements: Height: 5\' 8"  (172.7 cm) Weight: 284 lb 13.4 oz (129.2 kg) IBW/kg (Calculated) : 68.4 Adjusted Body Weight: 92Kg  Vital Signs: Temp: 98 F (36.7 C) (07/09 0428) Temp src: Oral (07/09 0428) BP: 117/68 mmHg (07/09 0428) Pulse Rate: 78 (07/09 0428) Intake/Output from previous day: 07/08 0701 - 07/09 0700 In: 1600 [P.O.:600; I.V.:1000] Out: -  Intake/Output from this shift:    Labs:  Recent Labs  07/24/12 1429 07/25/12 0530  WBC 8.8 11.1*  HGB 13.2 13.4  PLT 258 172  CREATININE 0.88 0.87   Estimated Creatinine Clearance: 134.7 ml/min (by C-G formula based on Cr of 0.87). No results found for this basename: VANCOTROUGH, VANCOPEAK, VANCORANDOM, GENTTROUGH, GENTPEAK, GENTRANDOM, TOBRATROUGH, TOBRAPEAK, TOBRARND, AMIKACINPEAK, AMIKACINTROU, AMIKACIN,  in the last 72 hours   Microbiology: No results found for this or any previous visit (from the past 720 hour(s)).  Medical History: Past Medical History  Diagnosis Date  . Hypertension    Medications:  Scheduled:  . [COMPLETED] sodium chloride   Intravenous STAT  . amLODipine  10 mg Oral Daily  . enoxaparin (LOVENOX) injection  40 mg Subcutaneous Q24H  . insulin aspart  0-20 Units Subcutaneous TID WC  . insulin aspart  0-5 Units Subcutaneous QHS  . insulin detemir  10 Units Subcutaneous QHS  . lisinopril  10 mg Oral Daily  . living well with diabetes book   Does not apply Once  . metFORMIN  500 mg Oral BID WC  . piperacillin-tazobactam (ZOSYN)  IV  3.375 g Intravenous Q8H  . sodium chloride  3 mL Intravenous Q12H  . vancomycin  2,000 mg Intravenous Once  . vancomycin  1,000 mg Intravenous Q8H   Assessment: 47yo morbidly obese male with h/o LE cellulitis.  Pt has good renal fxn.  Estimated Creatinine Clearance: 134.7 ml/min (by C-G formula based on Cr  of 0.87).  Goal of Therapy:  Vancomycin trough level 10-15 mcg/ml Eradicate infection.  Plan: Vancomycin 2000mg  IV now x 1 then 1000mg  IV q8hrs Check trough at steady state Zosyn 3.375gm IV q8hrs to be infused over 4 hours Monitor labs, renal fxn, and cultures per protocol guidelines  Valrie Hart A 07/25/2012,10:54 AM

## 2012-07-25 NOTE — Progress Notes (Signed)
Dressing applied to Rt,and Left leg Aquacel used when applying dressing per wound care orders.Will continue to monitor patient.

## 2012-07-25 NOTE — Plan of Care (Signed)
Problem: Consults Goal: Skin Care Protocol Initiated - if Braden Score 18 or less If consults are not indicated, leave blank or document N/A Outcome: Completed/Met Date Met:  07/25/12 Wound care consult today. Goal: Nutrition Consult-if indicated Outcome: Completed/Met Date Met:  07/25/12 Newly diagnosed diabetic,patient given educational handouts on being a diabetic,and calorie counting,also was given a diabetic insulin kit,verbalized understanding,says he toke care of his mother who was a diabetic.

## 2012-07-25 NOTE — Consult Note (Addendum)
WOC consult Note Reason for Consult: Consult requested for bilat leg cellulitis and wounds.  Performed via remote camera with bedside nurse assistance. Pt on IV Vancomycin. Wound type:Right leg with generalized erythremia, edema, partial thickness wound to posterior leg approx 2X1X.1cm, small yellow drainage, no odor. Left leg with patchy areas of previous blistering which have evolved into partial thickness wounds; extends to entire anterior and posterior calf in area approx 30X30cm Wound ZOX:[id number] yellow wound bed where blisters have ruptured Drainage (amount, consistency, odor) Large amt yellow drainage, some odor Periwound: generalized erythremia and edema to entire leg Dressing procedure/placement/frequency: Foam dressing to posterior right leg to absorb drainage Aquacel to left leg to provide antimicrobial benefits and absorb drainage.  Leg should be gently scrubbed during each dressing change to assist with removal of nonviable tissue. Aquacel can be discontinued when drainage is improved. Please re-consult if further assistance is needed.  Thank-you,  Cammie Mcgee MSN, RN, CWOCN, [place], CNS [phone]

## 2012-07-26 DIAGNOSIS — Z9119 Patient's noncompliance with other medical treatment and regimen: Secondary | ICD-10-CM

## 2012-07-26 LAB — BASIC METABOLIC PANEL
BUN: 17 mg/dL (ref 6–23)
Chloride: 102 mEq/L (ref 96–112)
Creatinine, Ser: 1.18 mg/dL (ref 0.50–1.35)
GFR calc Af Amer: 82 mL/min — ABNORMAL LOW (ref >90)

## 2012-07-26 LAB — GLUCOSE, CAPILLARY: Glucose-Capillary: 173 mg/dL — ABNORMAL HIGH (ref 70–99)

## 2012-07-26 MED ORDER — FUROSEMIDE 10 MG/ML IJ SOLN
40.0000 mg | Freq: Two times a day (BID) | INTRAMUSCULAR | Status: DC
  Administered 2012-07-26 (×2): 40 mg via INTRAVENOUS
  Filled 2012-07-26 (×2): qty 4

## 2012-07-26 NOTE — Clinical Documentation Improvement (Signed)
THIS DOCUMENT IS NOT A PERMANENT PART OF THE MEDICAL RECORD  Please update your documentation with the medical record to reflect your response to this query. If you need help knowing how to do this please call [phone].  07/26/12  Dear Dr. Karilyn Cota Marton Redwood A review of the patient medical record has revealed the following indicators. Based on your clinical judgment, please clarify and document in a progress note and/or discharge summary the clinical condition associated with the following supporting information:  Please clarify stated Obesity. Thank you.  H&P and subsequent progress notes list Obesity as active problems Height =  5'8" Weight = 284 lbs BMI = 44 Treatment: Patient counseled on weight loss. 1600 - 2000 Carb modified diet Daily weights    Possible Clinical conditions  Morbid Obesity W/ BMI= 44  Other condition___________________  Cannot Clinically determine _____________    Reviewed: additional documentation in the medical record  Thank You,  Debora T Williams RN Clinical Documentation Specialist: [phone] [facility] Health Information Management [place]

## 2012-07-26 NOTE — Progress Notes (Addendum)
Juan Freeman NGE:[id number] DOB: Aug 31, 1965 DOA: 07/24/2012 PCP: No PCP Per Patient   Subjective: This man was admitted with bilateral leg cellulitis and found to be diagnosed with diabetes and probably hypertension. He has been started on sliding scale of insulin and oral antihypertensive medications as well as intravenous antibiotics. His cellulitis, according to him, has not improved significantly. He is now complaining of back pain today and his cellulitis has not significantly improved either. Appreciate wound care consultation.           Physical Exam: Blood pressure 131/76, pulse 80, temperature 98 F (36.7 C), temperature source Oral, resp. rate 20, height 5\' 8"  (1.727 m), weight 129.2 kg (284 lb 13.4 oz), SpO2 99.00%. He looks systemically well. Is not toxic or septic. Heart sounds are present without murmurs or sounds. Lung fields are clear. He has bilateral lower leg cellulitis. Both his lower legs are bandaged, he says there is raw skin underneath. He is alert and orientated.   Investigations:     Basic Metabolic Panel:  Recent Labs  40/10/27 0530 07/26/12 0625  NA 133* 136  K 3.8 4.1  CL 100 102  CO2 21 26  GLUCOSE 207* 177*  BUN 12 17  CREATININE 0.87 1.18  CALCIUM 9.0 9.2   Liver Function Tests:  Recent Labs  07/24/12 1429  AST 19  ALT 26  ALKPHOS 130*  BILITOT 0.3  PROT 7.4  ALBUMIN 3.2*     CBC:  Recent Labs  07/24/12 1429 07/25/12 0530  WBC 8.8 11.1*  NEUTROABS 6.2  --   HGB 13.2 13.4  HCT 38.5* 39.5  MCV 91.0 91.4  PLT 258 172    Dg Chest Portable 1 View  07/24/2012   *RADIOLOGY REPORT*  Clinical Data: Leg swelling and cellulitis  PORTABLE CHEST - 1 VIEW  Comparison: 04/08/2011  Findings: Heart size is normal.  There is no pleural effusion identified.  No airspace consolidation.  Atelectasis is noted in the left base.  IMPRESSION:  1.  Left base atelectasis.   Original Report Authenticated By: Signa Kell, M.D.       Medications: I have reviewed the patient's current medications.  Impression: 1. Bilateral leg cellulitis, lower legs. Very poor foot hygiene. 2. Newly diagnosed uncontrolled type 2 diabetes mellitus. 3. Hypertension. 4. Morbid Obesity. 5. Possible diastolic dysfunction with heart failure her echocardiogram with grade 2 diastolic dysfunction.     Plan: 1. Start intravenous Lasix 40 mg intravenously twice a day. 2. Continue with intravenous antibiotics.  Consultants:  Wound care consult.   Procedures:  Echocardiogram: ------------------------------------------------------------ Study Conclusions  - Left ventricle: The cavity size was normal. There was mild concentric hypertrophy. Systolic function was normal. The estimated ejection fraction was in the range of 60% to 65%. Wall motion was normal; there were no regional wall motion abnormalities. Features are consistent with a pseudonormal left ventricular filling pattern, with concomitant abnormal relaxation and increased filling pressure (grade 2 diastolic dysfunction). There was no evidence of elevated ventricular filling pressure by Doppler parameters. - Mitral valve: Trivial regurgitation. - Left atrium: The atrium was mildly dilated. - Tricuspid valve: Trivial regurgitation. - Pulmonary arteries: Systolic pressure could not be accurately estimated. - Inferior vena cava: Not visualized. Unable to estimate CVP. - Pericardium, extracardiac: There was no pericardial effusion. Impressions:  - No prior study for comparison. Normal LV chamber size with mild LVH and LVEF 60-65%. Grade 2 diastolic dysfunction with normal filling pressures, mild left atrial enlargement. Aortic valve not well  seen. Trivial mitral and tricuspid regurgitation. Unable to assess PASP or CVP. Transthoracic echocardiography. M-mode, complete 2D, spectral Doppler, and color Doppler. Height: Height: 172.7cm. Height: 68in. Weight:  Weight: 128.8kg. Weight: 283.4lb. Body mass index: BMI: 43.2kg/m^2. Body surface area: BSA: 2.49m^2. Patient status: Inpatient. Location: Bedside.   Antibiotics:  Intravenous Zosyn started 07/25/2012.  Intravenous vancomycin started 07/25/2012.                   Code Status: Full code.  Family Communication: Discussed plan with patient at the bedside.   Disposition Plan: Home when medically stable.  Time spent: 20 minutes.   LOS: 2 days   Wilson Singer Pager [phone]  07/26/2012, 10:57 AM

## 2012-07-26 NOTE — Plan of Care (Signed)
Problem: Problem: Skin/Wound Progression Goal: OTHER SKIN/WOUND GOAL(S) Talked with pt about ways to take care of his skin and prevent further issues

## 2012-07-27 LAB — BASIC METABOLIC PANEL
CO2: 25 mEq/L (ref 19–32)
Calcium: 9.3 mg/dL (ref 8.4–10.5)
Glucose, Bld: 174 mg/dL — ABNORMAL HIGH (ref 70–99)
Sodium: 133 mEq/L — ABNORMAL LOW (ref 135–145)

## 2012-07-27 LAB — GLUCOSE, CAPILLARY
Glucose-Capillary: 162 mg/dL — ABNORMAL HIGH (ref 70–99)
Glucose-Capillary: 146 mg/dL — ABNORMAL HIGH (ref 70–99)

## 2012-07-27 LAB — VANCOMYCIN, RANDOM: Vancomycin Rm: 35.9 ug/mL

## 2012-07-27 MED ORDER — GLIPIZIDE 5 MG PO TABS
5.0000 mg | ORAL_TABLET | Freq: Every day | ORAL | Status: DC
  Administered 2012-07-28 – 2012-08-06 (×10): 5 mg via ORAL
  Filled 2012-07-27 (×11): qty 1

## 2012-07-27 MED ORDER — GLIPIZIDE 5 MG PO TABS: 5.0000 mg | ORAL_TABLET | Freq: Two times a day (BID) | ORAL | Status: DC

## 2012-07-27 MED ORDER — SODIUM CHLORIDE 0.9 % IV SOLN
INTRAVENOUS | Status: DC
  Administered 2012-07-27 – 2012-07-29 (×3): via INTRAVENOUS

## 2012-07-27 NOTE — Progress Notes (Addendum)
Juan Freeman WUJ:[id number] DOB: 1965-09-12 DOA: 07/24/2012 PCP: No PCP Per Patient   Subjective: This man was sitting in his chair in the dark as he always has been for the last couple of days. Unfortunately he is now in acute renal failure and his creatinine has jumped from 1.18 to 3.0. He has been on vancomycin, ACE inhibitor, intravenous Lasix and metformin. These may all be contributing factors. He has not much of an appetite at the present time.          Physical Exam: Blood pressure 103/66, pulse 86, temperature 98.1 F (36.7 C), temperature source Oral, resp. rate 20, height 5\' 8"  (1.727 m), weight 129.2 kg (284 lb 13.4 oz), SpO2 100.00%. He looks systemically well. Is not toxic or septic. Heart sounds are present without murmurs or sounds. Lung fields are clear. He has bilateral lower leg cellulitis. Both his lower legs are bandaged, he says there is raw skin underneath. He is alert and orientated.   Investigations:     Basic Metabolic Panel:  Recent Labs  95/62/13 0625 07/27/12 0557  NA 136 133*  K 4.1 4.0  CL 102 98  CO2 26 25  GLUCOSE 177* 174*  BUN 17 27*  CREATININE 1.18 3.00*  CALCIUM 9.2 9.3   Liver Function Tests:  Recent Labs  07/24/12 1429  AST 19  ALT 26  ALKPHOS 130*  BILITOT 0.3  PROT 7.4  ALBUMIN 3.2*     CBC:  Recent Labs  07/24/12 1429 07/25/12 0530  WBC 8.8 11.1*  NEUTROABS 6.2  --   HGB 13.2 13.4  HCT 38.5* 39.5  MCV 91.0 91.4  PLT 258 172    No results found.    Medications: I have reviewed the patient's current medications.  Impression: 1. Bilateral leg cellulitis, lower legs. Very poor foot hygiene. 2. Newly diagnosed uncontrolled type 2 diabetes mellitus. 3. Hypertension. 4. Morbid Obesity. 5. Diastolic dysfunction with no evidence of heart failure. BNP is entirely normal. 6. Acute renal failure.     Plan: 1. Vancomycin has been discontinued. I will now discontinue intravenous Lasix, metformin and ACE  inhibitor. We'll start him on intravenous fluids. Nephrology consultation. 2. Start glipizide 5 mg daily for diabetic control along with insulin and sliding scale of insulin. Consultants:  Wound care consult.   Procedures:  Echocardiogram: ------------------------------------------------------------ Study Conclusions  - Left ventricle: The cavity size was normal. There was mild concentric hypertrophy. Systolic function was normal. The estimated ejection fraction was in the range of 60% to 65%. Wall motion was normal; there were no regional wall motion abnormalities. Features are consistent with a pseudonormal left ventricular filling pattern, with concomitant abnormal relaxation and increased filling pressure (grade 2 diastolic dysfunction). There was no evidence of elevated ventricular filling pressure by Doppler parameters. - Mitral valve: Trivial regurgitation. - Left atrium: The atrium was mildly dilated. - Tricuspid valve: Trivial regurgitation. - Pulmonary arteries: Systolic pressure could not be accurately estimated. - Inferior vena cava: Not visualized. Unable to estimate CVP. - Pericardium, extracardiac: There was no pericardial effusion. Impressions:  - No prior study for comparison. Normal LV chamber size with mild LVH and LVEF 60-65%. Grade 2 diastolic dysfunction with normal filling pressures, mild left atrial enlargement. Aortic valve not well seen. Trivial mitral and tricuspid regurgitation. Unable to assess PASP or CVP. Transthoracic echocardiography. M-mode, complete 2D, spectral Doppler, and color Doppler. Height: Height: 172.7cm. Height: 68in. Weight: Weight: 128.8kg. Weight: 283.4lb. Body mass index: BMI: 43.2kg/m^2. Body surface area: BSA:  2.52m^2. Patient status: Inpatient. Location: Bedside.   Antibiotics:  Intravenous Zosyn started 07/25/2012.  Intravenous vancomycin started 07/25/2012. Vancomycin was stopped 07/27/2012.                   Code Status: Full code.  Family Communication: Discussed plan with patient at the bedside.   Disposition Plan: Home when medically stable.  Time spent: 20 minutes.   LOS: 3 days   Wilson Singer Pager [phone]  07/27/2012, 8:56 AM

## 2012-07-27 NOTE — Plan of Care (Signed)
Pt is refusing to follow safety protocol by insisting to not wear non-slip socks and also to keep green pads under his feet as he walks.  He's indicated that's the only way to take care of the drainage from his legs and he doesn't want to drip all over the floor as he walks.   Attempts were made to reason with the pt due to his safety and trying to prevent him from falling.  However, pt continues to decline help in changing his methods and verbally states, "he doesn't care." It is my belief that this pt is depressed, lonely and frustrated to his current situation.

## 2012-07-27 NOTE — Plan of Care (Signed)
Problem: Phase I Progression Outcomes Goal: Wound assessment- dressing change as appropriate Talked to pt about importance of proper daily dressing changes to help promote healing and prevent infection

## 2012-07-27 NOTE — Progress Notes (Signed)
ANTIBIOTIC CONSULT NOTE   Pharmacy Consult for Vancomycin and Zosyn Indication: bilateral LE cellulitis (L > R)  No Known Allergies  Patient Measurements: Height: 5\' 8"  (172.7 cm) Weight: 284 lb 13.4 oz (129.2 kg) IBW/kg (Calculated) : 68.4 Adjusted Body Weight: 92Kg  Vital Signs: Temp: 98.1 F (36.7 C) (07/11 0451) Temp src: Oral (07/11 0451) BP: 103/66 mmHg (07/11 0451) Pulse Rate: 86 (07/11 0451) Intake/Output from previous day: 07/10 0701 - 07/11 0700 In: 840 [P.O.:840] Out: -  Intake/Output from this shift:    Labs:  Recent Labs  07/24/12 1429 07/25/12 0530 07/26/12 0625 07/27/12 0557  WBC 8.8 11.1*  --   --   HGB 13.2 13.4  --   --   PLT 258 172  --   --   CREATININE 0.88 0.87 1.18 3.00*   Estimated Creatinine Clearance: 39.1 ml/min (by C-G formula based on Cr of 3).  Recent Labs  07/27/12 0300  VANCORANDOM 35.9    Microbiology: No results found for this or any previous visit (from the past 720 hour(s)).  Medical History: Past Medical History  Diagnosis Date  . Hypertension    Medications:  Scheduled:  . amLODipine  10 mg Oral Daily  . enoxaparin (LOVENOX) injection  40 mg Subcutaneous Q24H  . furosemide  40 mg Intravenous BID  . insulin aspart  0-20 Units Subcutaneous TID WC  . insulin aspart  0-5 Units Subcutaneous QHS  . insulin detemir  10 Units Subcutaneous QHS  . lisinopril  10 mg Oral Daily  . metFORMIN  500 mg Oral BID WC  . piperacillin-tazobactam (ZOSYN)  IV  3.375 g Intravenous Q8H  . sodium chloride  3 mL Intravenous Q12H   Assessment: 47yo morbidly obese male with h/o LE cellulitis was started on Vancomycin and Zosyn on admission.   SCr was at baseline on admission but is now elevated.  Pt in ARF, possibly Vancomycin induced.    Estimated Creatinine Clearance: 39.1 ml/min (by C-G formula based on Cr of 3).  Goal of Therapy:  Vancomycin trough level 10-15 mcg/ml Eradicate infection.  Plan: Hold Vancomycin for now Check  random Vancomycin level and SCr in am Continue Zosyn 3.375gm IV q8hrs to be infused over 4 hours Monitor labs, renal fxn, and cultures per protocol guidelines  Valrie Hart A 07/27/2012,7:53 AM

## 2012-07-27 NOTE — Plan of Care (Signed)
Pt  Is refusing to wear no-skid socks due to not wanting to saturate them from the weeping of his legs.  Pt also insists on having a green pad under his feet, as he walks, to not drip on the floor.  Pt has been educated about fall safety and asked to press his call light for ambulation assistance.

## 2012-07-28 DIAGNOSIS — N179 Acute kidney failure, unspecified: Secondary | ICD-10-CM

## 2012-07-28 LAB — CBC
HCT: 39.7 % (ref 39.0–52.0)
MCH: 30.6 pg (ref 26.0–34.0)
MCHC: 33.2 g/dL (ref 30.0–36.0)
RDW: 14.1 % (ref 11.5–15.5)

## 2012-07-28 LAB — SODIUM, URINE, RANDOM: Sodium, Ur: 21 mEq/L

## 2012-07-28 LAB — GLUCOSE, CAPILLARY
Glucose-Capillary: 95 mg/dL (ref 70–99)
Glucose-Capillary: 110 mg/dL — ABNORMAL HIGH (ref 70–99)

## 2012-07-28 LAB — BASIC METABOLIC PANEL
BUN: 33 mg/dL — ABNORMAL HIGH (ref 6–23)
CO2: 24 mEq/L (ref 19–32)
Chloride: 97 mEq/L (ref 96–112)
Creatinine, Ser: 3.61 mg/dL — ABNORMAL HIGH (ref 0.50–1.35)
Glucose, Bld: 160 mg/dL — ABNORMAL HIGH (ref 70–99)

## 2012-07-28 LAB — CREATININE, URINE, RANDOM: Creatinine, Urine: 162.18 mg/dL

## 2012-07-28 LAB — PROTEIN, URINE, RANDOM: Total Protein, Urine: 33 mg/dL

## 2012-07-28 MED ORDER — FUROSEMIDE 10 MG/ML IJ SOLN
40.0000 mg | Freq: Two times a day (BID) | INTRAMUSCULAR | Status: DC
  Administered 2012-07-28 – 2012-07-30 (×5): 40 mg via INTRAVENOUS
  Filled 2012-07-28 (×6): qty 4

## 2012-07-28 NOTE — Consult Note (Signed)
Juan Freeman MRN: [id number] DOB/AGE: 49-May-1967 49 y.o. Primary Care Physician:No PCP Per Patient Admit date: 07/24/2012 Chief Complaint:  Chief Complaint  Patient presents with  . Leg Swelling  . Cellulitis   HPI: Pt is 47 year old male with past medical history of untreated DM and HTN who came to ER with c/o leg swelling.  History of present illness date back to past 1-2 weeks when pt started developing Lower ext selling. It was progressive,Pt has pain was was taking NSAIDS. It was associated with increased drainage and erythema. Pt came to Er where he was diagnosed with cellulitis and  Admitted. Pt was noticed to have high Creat and nephrologys was  Consulted. Pt does not offers any new complaints  NO c/o chest pain  NO c/o dyspnea  NO c/o fever/ cough chills  No c/o hematuria     Past Medical History  Diagnosis Date  . Hypertension        Family History  Problem Relation Age of Onset  . Diabetes Mother   . Cancer Father     NO hx os ESRD   Social History:  reports that he has never smoked. He does not have any smokeless tobacco history on file. He reports that he does not drink alcohol or use illicit drugs.   Allergies: No Known Allergies  Medications Prior to Admission  Medication Sig Dispense Refill  . ibuprofen (ADVIL,MOTRIN) 200 MG tablet Take 200 mg by mouth every 6 (six) hours as needed for pain.           WGN:FAOZH from the symptoms mentioned above,tPt concern is foley catheter.   Marland Kitchen amLODipine  10 mg Oral Daily  . enoxaparin (LOVENOX) injection  40 mg Subcutaneous Q24H  . glipiZIDE  5 mg Oral QAC breakfast  . insulin aspart  0-20 Units Subcutaneous TID WC  . insulin aspart  0-5 Units Subcutaneous QHS  . insulin detemir  10 Units Subcutaneous QHS  . piperacillin-tazobactam (ZOSYN)  IV  3.375 g Intravenous Q8H  . sodium chloride  3 mL Intravenous Q12H       Physical Exam: Vital signs in last 24 hours: Temp:  [97.3 F (36.3 C)-98.2 F  (36.8 C)] 97.3 F (36.3 C) (07/12 0634) Pulse Rate:  [72-89] 72 (07/12 0634) Resp:  [15-20] 15 (07/12 0634) BP: (103-115)/(61-75) 115/61 mmHg (07/12 0634) SpO2:  [98 %-100 %] 100 % (07/12 0865) Weight change:  Last BM Date: 07/26/12  Intake/Output from previous day: 07/11 0701 - 07/12 0700 In: 840 [P.O.:840] Out: -  Total I/O In: 0  Out: 225 [Urine:225]   Physical Exam: General- pt is awake,alert, oriented to time place and person Resp- No acute REsp distress, CTA B/L NO Rhonchi CVS- S1S2 regular in rate and rhythm GIT- BS+, soft, Morbidly obsese,NT. EXT- 2+ LE Edema, Cyanosis CNS- CN 2-12 grossly intact.  Psych- Depressed mood and flat affect    Lab Results: CBC  Recent Labs  07/28/12 0620  WBC 10.7*  HGB 13.2  HCT 39.7  PLT 289    BMET  Recent Labs  07/27/12 0557 07/28/12 0620  NA 133* 134*  K 4.0 3.5  CL 98 97  CO2 25 24  GLUCOSE 174* 160*  BUN 27* 33*  CREATININE 3.00* 3.61*  CALCIUM 9.3 9.1   Creat trend 2014 1.18==>3.00==>3.61      Lab Results  Component Value Date   CALCIUM 9.1 07/28/2012   Vanco level  35.9  2d echo  mild LVH and  LVEF 60-65%. Grade 2 diastolic dysfunction with normal filling pressures, mild left atrial enlargement. Aortic valve not well seen. Trivial mitral and tricuspid regurgitation. Unable to assess PASP or CVP.     Impression: 1)Renal  AKI secondary to ATN/ prerenal/AIN AKI secondary to Multiple factors    NSAIDS + ACE + Hypovolemia( Uncontrolled DM Causes osmotic diuresis)+ High vanco   AKi worsening.   ? CKD from DM/ FSGS From sleep apnea vs Obesity related Gl  2)HTN Target Organ damage  CKD LVH  Medication-  On Calcium Channel Blockers    3)Anemia HGb at goal (9--11)   4)DM- uncontrolled  Being managed by Primary team  5)ID- cellulitis On IV ABX  Clinically better   6)Electrolytes   Normokalemic  Hyponatremic Secondary to Hypovolemia + hyperglycemia   7)Acid base Co2  at goal     Plan:  Agree wit current iVF Will suggest to change zosyn dose Will ask for UA and if positive for protein will quantify Will check Microl/creat ratio to see end organ damage Will ask for renal US Will ask for Hep Profile Will check ana Will check complements Will check urine  Eosinophils  Will check  FENA Will start lasix  May ned sleep study as oupt   Joelyn Lover S 07/28/2012, 10:55 AM

## 2012-07-28 NOTE — Progress Notes (Signed)
  Juan Freeman ZOX:[id number] DOB: Jan 11, 1966 DOA: 07/24/2012 PCP: No PCP Per Patient   Subjective: This man his renal function is deteriorating. I think is due to a combination of nephrotoxic medications including vancomycin, ACE inhibitor, intravenous Lasix and metformin.          Physical Exam: Blood pressure 115/61, pulse 72, temperature 97.3 F (36.3 C), temperature source Oral, resp. rate 15, height 5\' 8"  (1.727 m), weight 129.2 kg (284 lb 13.4 oz), SpO2 100.00%. He looks systemically well. Is not toxic or septic. Heart sounds are present without murmurs or sounds. Lung fields are clear. He has bilateral lower leg cellulitis. Both his lower legs are bandaged, he says there is raw skin underneath. He is alert and orientated.   Investigations:     Basic Metabolic Panel:  Recent Labs  81/19/14 0557 07/28/12 0620  NA 133* 134*  K 4.0 3.5  CL 98 97  CO2 25 24  GLUCOSE 174* 160*  BUN 27* 33*  CREATININE 3.00* 3.61*  CALCIUM 9.3 9.1      CBC:  Recent Labs  07/28/12 0620  WBC 10.7*  HGB 13.2  HCT 39.7  MCV 91.9  PLT 289    No results found.    Medications: I have reviewed the patient's current medications.  Impression: 1. Bilateral leg cellulitis, lower legs. Very poor foot hygiene. 2. Newly diagnosed uncontrolled type 2 diabetes mellitus. 3. Hypertension. 4. Morbid Obesity. 5. Diastolic dysfunction with no evidence of heart failure. BNP is entirely normal. 6. Acute renal failure, worsening.     Plan: 1.Insert Foley catheter to monitor urine output. 2. Nephrology consultation. 3. Continue with antibiotics for cellulitis. Consultants:  Wound care consult.   Procedures:  Echocardiogram: ------------------------------------------------------------ Study Conclusions  - Left ventricle: The cavity size was normal. There was mild concentric hypertrophy. Systolic function was normal. The estimated ejection fraction was in the range of 60%  to 65%. Wall motion was normal; there were no regional wall motion abnormalities. Features are consistent with a pseudonormal left ventricular filling pattern, with concomitant abnormal relaxation and increased filling pressure (grade 2 diastolic dysfunction). There was no evidence of elevated ventricular filling pressure by Doppler parameters. - Mitral valve: Trivial regurgitation. - Left atrium: The atrium was mildly dilated. - Tricuspid valve: Trivial regurgitation. - Pulmonary arteries: Systolic pressure could not be accurately estimated. - Inferior vena cava: Not visualized. Unable to estimate CVP. - Pericardium, extracardiac: There was no pericardial effusion. Impressions:  - No prior study for comparison. Normal LV chamber size with mild LVH and LVEF 60-65%. Grade 2 diastolic dysfunction with normal filling pressures, mild left atrial enlargement. Aortic valve not well seen. Trivial mitral and tricuspid regurgitation. Unable to assess PASP or CVP. Transthoracic echocardiography. M-mode, complete 2D, spectral Doppler, and color Doppler. Height: Height: 172.7cm. Height: 68in. Weight: Weight: 128.8kg. Weight: 283.4lb. Body mass index: BMI: 43.2kg/m^2. Body surface area: BSA: 2.30m^2. Patient status: Inpatient. Location: Bedside.   Antibiotics:  Intravenous Zosyn started 07/25/2012.  Intravenous vancomycin started 07/25/2012. Vancomycin was stopped 07/27/2012.                  Code Status: Full code.  Family Communication: Discussed plan with patient at the bedside.   Disposition Plan: Home when medically stable.  Time spent: 20 minutes.   LOS: 4 days   Wilson Singer Pager [phone]  07/28/2012, 8:42 AM

## 2012-07-29 ENCOUNTER — Inpatient Hospital Stay (HOSPITAL_COMMUNITY): Payer: MEDICAID

## 2012-07-29 LAB — GLUCOSE, CAPILLARY: Glucose-Capillary: 144 mg/dL — ABNORMAL HIGH (ref 70–99)

## 2012-07-29 LAB — BASIC METABOLIC PANEL
CO2: 23 mEq/L (ref 19–32)
Chloride: 98 mEq/L (ref 96–112)
Glucose, Bld: 138 mg/dL — ABNORMAL HIGH (ref 70–99)
Potassium: 3.3 mEq/L — ABNORMAL LOW (ref 3.5–5.1)
Sodium: 134 mEq/L — ABNORMAL LOW (ref 135–145)

## 2012-07-29 LAB — MICROALBUMIN / CREATININE URINE RATIO
Creatinine, Urine: 176.1 mg/dL
Microalb Creat Ratio: 26.1 mg/g (ref 0.0–30.0)
Microalb, Ur: 4.6 mg/dL — ABNORMAL HIGH (ref 0.00–1.89)

## 2012-07-29 MED ORDER — ONDANSETRON HCL 4 MG/2ML IJ SOLN
4.0000 mg | INTRAMUSCULAR | Status: DC | PRN
  Administered 2012-07-29 – 2012-07-31 (×2): 4 mg via INTRAVENOUS
  Filled 2012-07-29 (×2): qty 2

## 2012-07-29 MED ORDER — CITALOPRAM HYDROBROMIDE 20 MG PO TABS
20.0000 mg | ORAL_TABLET | Freq: Every day | ORAL | Status: DC
  Administered 2012-07-29 – 2012-08-06 (×9): 20 mg via ORAL
  Filled 2012-07-29 (×10): qty 1

## 2012-07-29 NOTE — Progress Notes (Signed)
Juan Freeman  MRN: [id number]  DOB/AGE: [birth year] 49 y.o.  Primary Care Physician:No PCP Per Patient  Admit date: 07/24/2012  Chief Complaint:  Chief Complaint  Patient presents with  . Leg Swelling  . Cellulitis    Freeman-Pt presented on  07/24/2012 with  Chief Complaint  Patient presents with  . Leg Swelling  . Cellulitis  .    Pt today feels better   . amLODipine  10 mg Oral Daily  . citalopram  20 mg Oral Daily  . enoxaparin (LOVENOX) injection  40 mg Subcutaneous Q24H  . furosemide  40 mg Intravenous BID  . glipiZIDE  5 mg Oral QAC breakfast  . insulin aspart  0-20 Units Subcutaneous TID WC  . insulin aspart  0-5 Units Subcutaneous QHS  . insulin detemir  10 Units Subcutaneous QHS  . piperacillin-tazobactam (ZOSYN)  IV  3.375 g Intravenous Q8H  . sodium chloride  3 mL Intravenous Q12H     Physical Exam: Vital signs in last 24 hours: Temp:  [97.2 F (36.2 C)-98.1 F (36.7 C)] 97.9 F (36.6 C) (07/13 0500) Pulse Rate:  [71-91] 91 (07/13 0500) Resp:  [16-20] 16 (07/13 0500) BP: (128-134)/(81-86) 128/83 mmHg (07/13 0500) SpO2:  [94 %-98 %] 98 % (07/13 0500) Weight change:  Last BM Date: 07/26/12  Intake/Output from previous day: 07/12 0701 - 07/13 0700 In: 5295 [P.O.:600; I.V.:4145; IV Piggyback:550] Out: 2125 [Urine:2125] Total I/O In: 240 [P.O.:240] Out: -    Physical Exam: General- pt is awake,alert, oriented to time place and person Resp- No acute REsp distress, CTA B/L NO Rhonchi CVS- S1S2 regular in rate and rhythm GIT- BS+, soft, NT, ND EXT- 1+LE Edema, NO Cyanosis   Lab Results: CBC  Recent Labs  07/28/12 0620  WBC 10.7*  HGB 13.2  HCT 39.7  PLT 289    BMET  Recent Labs  07/28/12 0620 07/29/12 0557  NA 134* 134*  K 3.5 3.3*  CL 97 98  CO2 24 23  GLUCOSE 160* 138*  BUN 33* 37*  CREATININE 3.61* 3.74*  CALCIUM 9.1 9.1   Creat trend  2014 1.18==>3.00==>3.61==>3.74    Lab Results  Component Value Date   CALCIUM 9.1  07/29/2012       Renal U/Freeman Right Kidney: Severely limited evaluation of the right kidney due  to large body habitus. The right kidney is very poorly visualized.  Estimated renal length is 11.0 cm. No obvious obstruction or mass.  Left Kidney: Left kidney is better seen than the right but still  there is limited evaluation of left kidney. Left kidney measures  11.4 cm. No obstruction or mass.  Bladder: Empty bladder with Foley catheter.  IMPRESSION:  Ultrasound is markedly limited in this patient due to large patient  size is well as hepatomegaly and fatty infiltration of liver.  Renal size appears normal and there is no hydronephrosis.   FENa less than 1%  Spot Protein/ Crea ratio 33/162=203 millgrams  Impression: 1)Renal AKI secondary to ATN/ prerenal/AIN  AKI secondary to Multiple factors  NSAIDS + ACE + Hypovolemia( Uncontrolled DM Causes osmotic diuresis)+ High vanco  Pt FEna less than 1 but adequate output - Most likely Non Oliguric ATN NON Oliguric ATN-ATN now plateauing. Not much change in creat in past 24 hrs.   2)HTN  Target Organ damage  CKD  LVH  Medication-  On Calcium Channel Blockers   3)Anemia HGb at goal (9--11)   4)DM- uncontrolled  Being managed by Primary team  5)ID- cellulitis  On IV ABX  Clinically better   6)Electrolytes  Hypokalemic  Sec to Diuretics  Hyponatremic -stable Secondary to Hypovolemia + hyperglycemia   7)Acid base  Co2 at goal       Plan:  Will change IVF- add KCL Will continue lasix for now       Juan Freeman 07/29/2012, 12:41 PM

## 2012-07-29 NOTE — Progress Notes (Signed)
Juan Freeman ZOX:[id number] DOB: Dec 09, 1965 DOA: 07/24/2012 PCP: No PCP Per Patient   Subjective: This man's renal function is deteriorating. I think is due to a combination of nephrotoxic medications including vancomycin, ACE inhibitor, intravenous Lasix and metformin. Appreciate nephrology input. Today his creatinine actually has increased further.          Physical Exam: Blood pressure 128/83, pulse 91, temperature 97.9 F (36.6 C), temperature source Oral, resp. rate 16, height 5\' 8"  (1.727 m), weight 129.2 kg (284 lb 13.4 oz), SpO2 98.00%. He looks systemically well. Is not toxic or septic. Heart sounds are present without murmurs or sounds. Lung fields are clear. He has bilateral lower leg cellulitis. Both his lower legs are bandaged, he says there is raw skin underneath. He does have pitting edema of both his lower legs. He is alert and orientated. His affect is rather depressed.   Investigations:     Basic Metabolic Panel:  Recent Labs  81/19/14 0620 07/29/12 0557  NA 134* 134*  K 3.5 3.3*  CL 97 98  CO2 24 23  GLUCOSE 160* 138*  BUN 33* 37*  CREATININE 3.61* 3.74*  CALCIUM 9.1 9.1      CBC:  Recent Labs  07/28/12 0620  WBC 10.7*  HGB 13.2  HCT 39.7  MCV 91.9  PLT 289    No results found.    Medications: I have reviewed the patient's current medications.  Impression: 1. Bilateral leg cellulitis, lower legs. Very poor foot hygiene. 2. Newly diagnosed uncontrolled type 2 diabetes mellitus. 3. Hypertension. 4. Morbid Obesity. 5. Diastolic dysfunction with no evidence of heart failure. BNP is entirely normal. 6. Acute renal failure, worsening. 7. Depression.     Plan: 1. Continue with recommendations from nephrology for IV fluids and IV Lasix. Consider discontinuing IV Lasix at this point, I will leave this to the discretion of the nephrologist. 2. Start antidepressant. Consultants:  Wound care  consult.   Procedures:  Echocardiogram: ------------------------------------------------------------ Study Conclusions  - Left ventricle: The cavity size was normal. There was mild concentric hypertrophy. Systolic function was normal. The estimated ejection fraction was in the range of 60% to 65%. Wall motion was normal; there were no regional wall motion abnormalities. Features are consistent with a pseudonormal left ventricular filling pattern, with concomitant abnormal relaxation and increased filling pressure (grade 2 diastolic dysfunction). There was no evidence of elevated ventricular filling pressure by Doppler parameters. - Mitral valve: Trivial regurgitation. - Left atrium: The atrium was mildly dilated. - Tricuspid valve: Trivial regurgitation. - Pulmonary arteries: Systolic pressure could not be accurately estimated. - Inferior vena cava: Not visualized. Unable to estimate CVP. - Pericardium, extracardiac: There was no pericardial effusion. Impressions:  - No prior study for comparison. Normal LV chamber size with mild LVH and LVEF 60-65%. Grade 2 diastolic dysfunction with normal filling pressures, mild left atrial enlargement. Aortic valve not well seen. Trivial mitral and tricuspid regurgitation. Unable to assess PASP or CVP. Transthoracic echocardiography. M-mode, complete 2D, spectral Doppler, and color Doppler. Height: Height: 172.7cm. Height: 68in. Weight: Weight: 128.8kg. Weight: 283.4lb. Body mass index: BMI: 43.2kg/m^2. Body surface area: BSA: 2.51m^2. Patient status: Inpatient. Location: Bedside.   Antibiotics:  Intravenous Zosyn started 07/25/2012.  Intravenous vancomycin started 07/25/2012. Vancomycin was stopped 07/27/2012.                  Code Status: Full code.  Family Communication: Discussed plan with patient at the bedside.   Disposition Plan: Home when medically stable.  Time spent: 20 minutes.   LOS: 5 days   Wilson Singer Pager [phone]  07/29/2012, 8:37 AM

## 2012-07-29 NOTE — Progress Notes (Signed)
Patient tried to get in the bed, chair and finally set on the bench most of the night.  The patient states that he is pain continually and nothing is able to relieve it, refuses pain medications when offered at times.  Patient was offered and air mattress and bath to help relax him with refusals to both.  Patient does state that and admit that he is depressed at this time.

## 2012-07-30 DIAGNOSIS — R600 Localized edema: Secondary | ICD-10-CM | POA: Diagnosis present

## 2012-07-30 DIAGNOSIS — N179 Acute kidney failure, unspecified: Secondary | ICD-10-CM | POA: Diagnosis not present

## 2012-07-30 DIAGNOSIS — I5189 Other ill-defined heart diseases: Secondary | ICD-10-CM | POA: Diagnosis present

## 2012-07-30 DIAGNOSIS — I519 Heart disease, unspecified: Secondary | ICD-10-CM

## 2012-07-30 LAB — ANTI-DNA ANTIBODY, DOUBLE-STRANDED: ds DNA Ab: 2 IU/mL (ref <30)

## 2012-07-30 LAB — GLUCOSE, CAPILLARY
Glucose-Capillary: 107 mg/dL — ABNORMAL HIGH (ref 70–99)
Glucose-Capillary: 132 mg/dL — ABNORMAL HIGH (ref 70–99)

## 2012-07-30 LAB — BASIC METABOLIC PANEL
BUN: 40 mg/dL — ABNORMAL HIGH (ref 6–23)
Chloride: 100 mEq/L (ref 96–112)
Creatinine, Ser: 3.67 mg/dL — ABNORMAL HIGH (ref 0.50–1.35)
GFR calc Af Amer: 21 mL/min — ABNORMAL LOW (ref >90)
Glucose, Bld: 120 mg/dL — ABNORMAL HIGH (ref 70–99)

## 2012-07-30 LAB — C4 COMPLEMENT: Complement C4, Body Fluid: 34 mg/dL (ref 10–40)

## 2012-07-30 LAB — ANA: Anti Nuclear Antibody(ANA): NEGATIVE

## 2012-07-30 LAB — C3 COMPLEMENT: C3 Complement: 229 mg/dL — ABNORMAL HIGH (ref 90–180)

## 2012-07-30 MED ORDER — POTASSIUM CHLORIDE IN NACL 20-0.9 MEQ/L-% IV SOLN
INTRAVENOUS | Status: DC
  Administered 2012-07-30 – 2012-08-04 (×12): via INTRAVENOUS
  Administered 2012-08-04: 1 mL via INTRAVENOUS
  Administered 2012-08-04: 02:00:00 via INTRAVENOUS

## 2012-07-30 NOTE — Progress Notes (Signed)
Patient is still sleeping on bench in his room. Patient is still refusing to lay down or up in chair with legs raised at this time. Patient is refusing any help or assistance with a bath as well.   Patient's IV still needs to be changed and patient is refusing our protocols at this time.  MD aware per first shift nurse.

## 2012-07-30 NOTE — Progress Notes (Signed)
Subjective: Interval History: Nausea or vomiting. Patient says that he's feeling better. Presently he doesn't offer any complaints.  Objective: Vital signs in last 24 hours: Temp:  [97.4 F (36.3 C)-98.3 F (36.8 C)] 97.4 F (36.3 C) (07/14 0422) Pulse Rate:  [79-80] 79 (07/14 0422) Resp:  [16-18] 16 (07/14 0422) BP: (122-123)/(79-80) 123/79 mmHg (07/14 0422) SpO2:  [97 %-100 %] 100 % (07/14 0422) Weight change:   Intake/Output from previous day: 07/13 0701 - 07/14 0700 In: 2266 [P.O.:1160; I.V.:956; IV Piggyback:150] Out: 2300 [Urine:2300] Intake/Output this shift:    General appearance: alert, cooperative and no distress Resp: clear to auscultation bilaterally Cardio: regular rate and rhythm, S1, S2 normal, no murmur, click, rub or gallop GI: soft, non-tender; bowel sounds normal; no masses,  no organomegaly Extremities: edema Bilateral 2+ edema and also erythema. A part of his leg is dressed.  Lab Results:  Recent Labs  07/28/12 0620  WBC 10.7*  HGB 13.2  HCT 39.7  PLT 289   BMET:  Recent Labs  07/29/12 0557 07/30/12 0511  NA 134* 137  K 3.3* 3.4*  CL 98 100  CO2 23 25  GLUCOSE 138* 120*  BUN 37* 40*  CREATININE 3.74* 3.67*  CALCIUM 9.1 8.9   No results found for this basename: PTH,  in the last 72 hours Iron Studies: No results found for this basename: IRON, TIBC, TRANSFERRIN, FERRITIN,  in the last 72 hours  Studies/Results: US Renal  07/29/2012   *RADIOLOGY REPORT*  Clinical Data: Renal insufficiency.  Check renal size  RENAL/URINARY TRACT ULTRASOUND COMPLETE  Comparison:  None  Findings:  Right Kidney:  Severely limited evaluation of the right kidney due to large body habitus.  The right kidney is very poorly visualized. Estimated renal length is 11.0 cm.  No obvious obstruction or mass.  Left Kidney:  Left kidney is better seen than the right but still there is limited evaluation of left kidney.  Left kidney measures 11.4 cm.  No obstruction or mass.   Bladder:  Empty bladder with Foley catheter.  IMPRESSION: Ultrasound is markedly limited in this patient due to large patient size is well as hepatomegaly and fatty infiltration of liver.  Renal size appears normal and there is no hydronephrosis.   Original Report Authenticated By: Janeece Riggers, M.D.    I have reviewed the patient's current medications.  Assessment/Plan: Problem #1 acute kidney injury his BUN is 40 and creatinine is 3.67 renal function seems to be improving. Presently is none oliguric. Problem #2 hypokalemia potassium 3.4 improving Problem #3 hyponatremia sodium 137 has corrected. Problem #4 hypertension his blood pressure seems to be reasonably controlled Problem #5 diabetes Problem #6 cellulitis of his leg bilateral patient on antibiotics. Patient is a febrile his white blood cell count is slightly high. Problem #7 metabolic bone disease calcium was in acceptable range. His phosphorus sodium. Problem #8 acid-base balance he CO2 is was in acceptable range. Plan: Continue his present management We'll check his phosphorus, 25 vitamin D and basic metabolic panel in the morning.   LOS: 6 days   Rakwon Letourneau S 07/30/2012,7:58 AM

## 2012-07-30 NOTE — Progress Notes (Signed)
07/30/12 1329 Patient requested foley catheter be taken out. Dr. Kerry Hough discussed with patient, order placed to d/c foley, pt agreeable to take lasix as ordered after speaking with MD. Lasix given as ordered, pt then refused to have foley catheter removed. States had urinary retention at home prior to admission and does not want to have catheter reinserted if unable to void. Discussed with Dr. Kerry Hough, states okay to leave foley catheter in place at this time. Earnstine Regal, RN

## 2012-07-30 NOTE — Progress Notes (Signed)
[facility] PROGRESS NOTE  Juan MCCLENAHAN WJX:[id number] DOB: [birth year] DOA: 07/24/2012 PCP: No PCP Per Patient  Assessment/Plan: 1. Acute renal failure. Creatinine appears to be plateauing. Appreciate nephrology assistance. Continue current treatments with IV fluids and Lasix. Patient has Foley catheter in place. It is unclear if this was placed for urinary retention. We'll continue for now. 2. Type 2 diabetes. Continue with sliding scale insulin and glipizide. Blood sugars are stable 3. Bilateral lower extremity cellulitis. Likely has an element of venous stasis. He is currently on Zosyn. Continue to diurese and keep legs elevated. Ejection fraction is intact. He does have some diastolic dysfunction. 4. Impression, started on Celexa 5. Morbid obesity  Code Status: full code Family Communication: discussed with patient, no family at bedside Disposition Plan: discharge home once improved   Consultants:  Nephrology  Procedures:  none  Antibiotics:  Zosyn 7/9 -   Vancomycin 7/9 - 7/11  HPI/Subjective: Does not feel well today.  Denies any shortness of breath.  Has significant discomfort from foley catheter.  Objective: Filed Vitals:   07/29/12 0500 07/29/12 2054 07/30/12 0422 07/30/12 0758  BP: 128/83 122/80 123/79   Pulse: 91 80 79   Temp: 97.9 F (36.6 C) 98.3 F (36.8 C) 97.4 F (36.3 C)   TempSrc: Oral Oral Oral   Resp: 16 18 16    Height:      Weight:    125.782 kg (277 lb 4.8 oz)  SpO2: 98% 97% 100%     Intake/Output Summary (Last 24 hours) at 07/30/12 1311 Last data filed at 07/30/12 1233  Gross per 24 hour  Intake   2079 ml  Output   2300 ml  Net   -221 ml   Filed Weights   07/24/12 1306 07/24/12 1753 07/30/12 0758  Weight: 131.543 kg (290 lb) 129.2 kg (284 lb 13.4 oz) 125.782 kg (277 lb 4.8 oz)    Exam:   General:  NAD  Cardiovascular: S1, S2 RRR  Respiratory: CTA B  Abdomen: soft, nt, nd, bs+  Musculoskeletal: 2+ edema b/l. Gauze  applied to lower legs bilaterally   Data Reviewed: Basic Metabolic Panel:  Recent Labs Lab 07/26/12 0625 07/27/12 0557 07/28/12 0620 07/29/12 0557 07/30/12 0511  NA 136 133* 134* 134* 137  K 4.1 4.0 3.5 3.3* 3.4*  CL 102 98 97 98 100  CO2 26 25 24 23 25   GLUCOSE 177* 174* 160* 138* 120*  BUN 17 27* 33* 37* 40*  CREATININE 1.18 3.00* 3.61* 3.74* 3.67*  CALCIUM 9.2 9.3 9.1 9.1 8.9   Liver Function Tests:  Recent Labs Lab 07/24/12 1429  AST 19  ALT 26  ALKPHOS 130*  BILITOT 0.3  PROT 7.4  ALBUMIN 3.2*   No results found for this basename: LIPASE, AMYLASE,  in the last 168 hours No results found for this basename: AMMONIA,  in the last 168 hours CBC:  Recent Labs Lab 07/24/12 1429 07/25/12 0530 07/28/12 0620  WBC 8.8 11.1* 10.7*  NEUTROABS 6.2  --   --   HGB 13.2 13.4 13.2  HCT 38.5* 39.5 39.7  MCV 91.0 91.4 91.9  PLT 258 172 289   Cardiac Enzymes: No results found for this basename: CKTOTAL, CKMB, CKMBINDEX, TROPONINI,  in the last 168 hours BNP (last 3 results)  Recent Labs  07/24/12 1429 07/27/12 0557  PROBNP 28.5 29.2   CBG:  Recent Labs Lab 07/29/12 1148 07/29/12 1605 07/29/12 2041 07/30/12 0755 07/30/12 1200  GLUCAP 117* 139* 97 107* 132*  No results found for this or any previous visit (from the past 240 hour(s)).   Studies: US Renal  07/29/2012   *RADIOLOGY REPORT*  Clinical Data: Renal insufficiency.  Check renal size  RENAL/URINARY TRACT ULTRASOUND COMPLETE  Comparison:  None  Findings:  Right Kidney:  Severely limited evaluation of the right kidney due to large body habitus.  The right kidney is very poorly visualized. Estimated renal length is 11.0 cm.  No obvious obstruction or mass.  Left Kidney:  Left kidney is better seen than the right but still there is limited evaluation of left kidney.  Left kidney measures 11.4 cm.  No obstruction or mass.  Bladder:  Empty bladder with Foley catheter.  IMPRESSION: Ultrasound is markedly  limited in this patient due to large patient size is well as hepatomegaly and fatty infiltration of liver.  Renal size appears normal and there is no hydronephrosis.   Original Report Authenticated By: Janeece Riggers, M.D.    Scheduled Meds: . amLODipine  10 mg Oral Daily  . citalopram  20 mg Oral Daily  . enoxaparin (LOVENOX) injection  40 mg Subcutaneous Q24H  . furosemide  40 mg Intravenous BID  . glipiZIDE  5 mg Oral QAC breakfast  . insulin aspart  0-20 Units Subcutaneous TID WC  . insulin aspart  0-5 Units Subcutaneous QHS  . insulin detemir  10 Units Subcutaneous QHS  . piperacillin-tazobactam (ZOSYN)  IV  3.375 g Intravenous Q8H  . sodium chloride  3 mL Intravenous Q12H   Continuous Infusions: . 0.9 % NaCl with KCl 20 mEq / L 150 mL/hr at 07/30/12 1236    Active Problems:   Cellulitis   Hypertension   Obesity   Hyperglycemia    Time spent:    [facility]  [facility] Pager [phone]. If 7PM-7AM, please contact night-coverage at [email or web address], password [facility] 07/30/2012, 1:11 PM  LOS: 6 days

## 2012-07-30 NOTE — Progress Notes (Signed)
07/30/12 1134 Patient has been refusing to have bedalarm on per report from night shift RN. Discussed fall prevention/safety with patient this morning and instructed to call for assistance, not attempt getting up on his own for safety. Bedalarm was applied this morning. Pt refused lasix as ordered this am, stated "it overworks my kidneys and makes my back hurt". Notified Dr. Kerry Hough. MD to see patient. Earnstine Regal, RN

## 2012-07-30 NOTE — Progress Notes (Signed)
ANTIBIOTIC CONSULT NOTE   Pharmacy Consult for Zosyn Indication: bilateral LE cellulitis (L > R)  No Known Allergies  Patient Measurements: Height: 5\' 8"  (172.7 cm) Weight: 277 lb 4.8 oz (125.782 kg) IBW/kg (Calculated) : 68.4 Adjusted Body Weight: 92Kg  Vital Signs: Temp: 97.4 F (36.3 C) (07/14 0422) Temp src: Oral (07/14 0422) BP: 123/79 mmHg (07/14 0422) Pulse Rate: 79 (07/14 0422) Intake/Output from previous day: 07/13 0701 - 07/14 0700 In: 2266 [P.O.:1160; I.V.:956; IV Piggyback:150] Out: 2300 [Urine:2300] Intake/Output from this shift:    Labs:  Recent Labs  07/28/12 0620 07/28/12 1241 07/29/12 0557 07/30/12 0511  WBC 10.7*  --   --   --   HGB 13.2  --   --   --   PLT 289  --   --   --   LABCREA  --  162.18  176.1  --   --   CREATININE 3.61*  --  3.74* 3.67*   Estimated Creatinine Clearance: 31.5 ml/min (by C-G formula based on Cr of 3.67).  Recent Labs  07/28/12 0622  VANCORANDOM 18.7    Microbiology: No results found for this or any previous visit (from the past 720 hour(s)).  Medical History: Past Medical History  Diagnosis Date  . Hypertension    Medications:  Scheduled:  . amLODipine  10 mg Oral Daily  . citalopram  20 mg Oral Daily  . enoxaparin (LOVENOX) injection  40 mg Subcutaneous Q24H  . furosemide  40 mg Intravenous BID  . glipiZIDE  5 mg Oral QAC breakfast  . insulin aspart  0-20 Units Subcutaneous TID WC  . insulin aspart  0-5 Units Subcutaneous QHS  . insulin detemir  10 Units Subcutaneous QHS  . piperacillin-tazobactam (ZOSYN)  IV  3.375 g Intravenous Q8H  . sodium chloride  3 mL Intravenous Q12H   Assessment: 47yo morbidly obese male with h/o LE cellulitis was started on Vancomycin and Zosyn on admission.   SCr worsened and pt is in ARF, most likely due to a combination of nephrotoxic medications.  Nephrology is now managing and Vancomycin has been d/c'd.  Pt remains on Zosyn.  Pt is currently afebrile.     Estimated  Creatinine Clearance: 31.5 ml/min (by C-G formula based on Cr of 3.67).  Goal of Therapy:  Eradicate infection.  Plan: Continue Zosyn 3.375gm IV q8hrs to be infused over 4 hours (for clcr > 20) Monitor labs, renal fxn, and cultures per protocol guidelines  Valrie Hart A 07/30/2012,8:55 AM

## 2012-07-30 NOTE — Progress Notes (Signed)
07/30/12 1354 Patient up in bathroom, stated foley catheter seemed to be leaking. Requested foley catheter d/c, order already in place. Foley catheter d/c'd at 1350, pt tolerated well. Voided independently after foley d/c'd. Earnstine Regal, RN

## 2012-07-31 LAB — BASIC METABOLIC PANEL
BUN: 37 mg/dL — ABNORMAL HIGH (ref 6–23)
CO2: 25 mEq/L (ref 19–32)
Calcium: 8.8 mg/dL (ref 8.4–10.5)
Creatinine, Ser: 3.59 mg/dL — ABNORMAL HIGH (ref 0.50–1.35)
GFR calc non Af Amer: 18 mL/min — ABNORMAL LOW (ref >90)
Glucose, Bld: 108 mg/dL — ABNORMAL HIGH (ref 70–99)

## 2012-07-31 LAB — VITAMIN D 25 HYDROXY (VIT D DEFICIENCY, FRACTURES): Vit D, 25-Hydroxy: 23 ng/mL — ABNORMAL LOW (ref 30–89)

## 2012-07-31 LAB — CBC
MCH: 31 pg (ref 26.0–34.0)
MCHC: 33.7 g/dL (ref 30.0–36.0)
MCV: 91.9 fL (ref 78.0–100.0)
Platelets: 256 10*3/uL (ref 150–400)

## 2012-07-31 LAB — GLUCOSE, CAPILLARY
Glucose-Capillary: 109 mg/dL — ABNORMAL HIGH (ref 70–99)
Glucose-Capillary: 85 mg/dL (ref 70–99)

## 2012-07-31 LAB — PHOSPHORUS: Phosphorus: 4.5 mg/dL (ref 2.3–4.6)

## 2012-07-31 MED ORDER — FUROSEMIDE 10 MG/ML IJ SOLN
60.0000 mg | Freq: Two times a day (BID) | INTRAMUSCULAR | Status: DC
  Administered 2012-07-31 – 2012-08-04 (×8): 60 mg via INTRAVENOUS
  Filled 2012-07-31 (×8): qty 6

## 2012-07-31 NOTE — Progress Notes (Signed)
Subjective: Interval History:Complains of poor appetite and nausea.. Presently patient denies any difficulty in breathinng. Patient presently does not have any vomiting. He denies also any diarrhea.  Objective: Vital signs in last 24 hours: Temp:  [98.1 F (36.7 C)-98.7 F (37.1 C)] 98.7 F (37.1 C) (07/15 0554) Pulse Rate:  [82-90] 82 (07/15 0554) Resp:  [18] 18 (07/15 0554) BP: (107-108)/(71-72) 108/71 mmHg (07/15 0554) SpO2:  [95 %-98 %] 98 % (07/15 0554) Weight:  [125.329 kg (276 lb 4.8 oz)-125.782 kg (277 lb 4.8 oz)] 125.329 kg (276 lb 4.8 oz) (07/15 0554) Weight change:   Intake/Output from previous day: 07/14 0701 - 07/15 0700 In: 3478 [P.O.:960; I.V.:2418; IV Piggyback:100] Out: 2150 [Urine:2150] Intake/Output this shift:    General appearance: alert, cooperative and no distress Resp: clear to auscultation bilaterally Cardio: regular rate and rhythm, S1, S2 normal, no murmur, click, rub or gallop GI: soft, non-tender; bowel sounds normal; no masses,  no organomegaly Extremities: edema Bilateral 2+ edema and also erythema. A part of his leg is dressed.  Lab Results:  Recent Labs  07/31/12 0509  WBC 9.2  HGB 11.9*  HCT 35.3*  PLT 256   BMET:   Recent Labs  07/30/12 0511 07/31/12 0509  NA 137 139  K 3.4* 3.6  CL 100 102  CO2 25 25  GLUCOSE 120* 108*  BUN 40* 37*  CREATININE 3.67* 3.59*  CALCIUM 8.9 8.8   No results found for this basename: PTH,  in the last 72 hours Iron Studies: No results found for this basename: IRON, TIBC, TRANSFERRIN, FERRITIN,  in the last 72 hours  Studies/Results: US Renal  07/29/2012   *RADIOLOGY REPORT*  Clinical Data: Renal insufficiency.  Check renal size  RENAL/URINARY TRACT ULTRASOUND COMPLETE  Comparison:  None  Findings:  Right Kidney:  Severely limited evaluation of the right kidney due to large body habitus.  The right kidney is very poorly visualized. Estimated renal length is 11.0 cm.  No obvious obstruction or mass.   Left Kidney:  Left kidney is better seen than the right but still there is limited evaluation of left kidney.  Left kidney measures 11.4 cm.  No obstruction or mass.  Bladder:  Empty bladder with Foley catheter.  IMPRESSION: Ultrasound is markedly limited in this patient due to large patient size is well as hepatomegaly and fatty infiltration of liver.  Renal size appears normal and there is no hydronephrosis.   Original Report Authenticated By: Janeece Riggers, M.D.    I have reviewed the patient's current medications.  Assessment/Plan: Problem #1 acute kidney injury his BUN is 37and creatinine is 3.59 renal function seems to be improving. Presently is none oliguric. Problem #2 hypokalemia: His  potassium is 3.6 corrected.  Problem #3 hyponatremia sodium 139 has corrected. Problem #4 hypertension his blood pressure seems to be reasonably controlled Problem #5 diabetes Problem #6 cellulitis of his leg bilateral patient on antibiotics. Patient is a febrile his white blood cell count is normal.. Problem #7 metabolic bone disease calcium was in acceptable range. His phosphorus is with in range Problem #8 acid-base balance he CO2 is was in acceptable range. Plan: Continue his present management We'll check his basic metabolic panel in the morning.   LOS: 7 days   Shaylinn Hladik S 07/31/2012,7:24 AM

## 2012-07-31 NOTE — Progress Notes (Signed)
Juan Freeman RUE:[id number] DOB: 08/02/1965 DOA: 07/24/2012 PCP: No PCP Per Patient   Subjective: This man's renal function is now improving slowly. I think is due to a combination of nephrotoxic medications including vancomycin, ACE inhibitor, intravenous Lasix and metformin. Appreciate nephrology input. He does appear to feel somewhat better.          Physical Exam: Blood pressure 108/71, pulse 82, temperature 98.7 F (37.1 C), temperature source Oral, resp. rate 18, height 5\' 8"  (1.727 m), weight 125.329 kg (276 lb 4.8 oz), SpO2 98.00%. He looks systemically well. Is not toxic or septic. Heart sounds are present without murmurs or sounds. Lung fields are clear. He has bilateral lower leg cellulitis. Both his lower legs are bandaged, he says there is raw skin underneath. He does have pitting edema of both his lower legs. He is alert and orientated. His affect is rather depressed.   Investigations:     Basic Metabolic Panel:  Recent Labs  14/78/29 0511 07/31/12 0509  NA 137 139  K 3.4* 3.6  CL 100 102  CO2 25 25  GLUCOSE 120* 108*  BUN 40* 37*  CREATININE 3.67* 3.59*  CALCIUM 8.9 8.8  PHOS  --  4.5      CBC:  Recent Labs  07/31/12 0509  WBC 9.2  HGB 11.9*  HCT 35.3*  MCV 91.9  PLT 256    No results found.    Medications: I have reviewed the patient's current medications.  Impression: 1. Bilateral leg cellulitis, lower legs. Very poor foot hygiene. 2. Newly diagnosed uncontrolled type 2 diabetes mellitus. 3. Hypertension. 4. Morbid Obesity. 5. Diastolic dysfunction with no evidence of heart failure. BNP is entirely normal. 6. Acute renal failure, improving. 7. Depression.     Plan: 1. Continue with recommendations from nephrology for IV fluids and IV Lasix.   Consultants:  Wound care consult.   Procedures:  Echocardiogram: ------------------------------------------------------------ Study Conclusions  - Left ventricle: The cavity  size was normal. There was mild concentric hypertrophy. Systolic function was normal. The estimated ejection fraction was in the range of 60% to 65%. Wall motion was normal; there were no regional wall motion abnormalities. Features are consistent with a pseudonormal left ventricular filling pattern, with concomitant abnormal relaxation and increased filling pressure (grade 2 diastolic dysfunction). There was no evidence of elevated ventricular filling pressure by Doppler parameters. - Mitral valve: Trivial regurgitation. - Left atrium: The atrium was mildly dilated. - Tricuspid valve: Trivial regurgitation. - Pulmonary arteries: Systolic pressure could not be accurately estimated. - Inferior vena cava: Not visualized. Unable to estimate CVP. - Pericardium, extracardiac: There was no pericardial effusion. Impressions:  - No prior study for comparison. Normal LV chamber size with mild LVH and LVEF 60-65%. Grade 2 diastolic dysfunction with normal filling pressures, mild left atrial enlargement. Aortic valve not well seen. Trivial mitral and tricuspid regurgitation. Unable to assess PASP or CVP. Transthoracic echocardiography. M-mode, complete 2D, spectral Doppler, and color Doppler. Height: Height: 172.7cm. Height: 68in. Weight: Weight: 128.8kg. Weight: 283.4lb. Body mass index: BMI: 43.2kg/m^2. Body surface area: BSA: 2.82m^2. Patient status: Inpatient. Location: Bedside.   Antibiotics:  Intravenous Zosyn started 07/25/2012.  Intravenous vancomycin started 07/25/2012. Vancomycin was stopped 07/27/2012.                  Code Status: Full code.  Family Communication: Discussed plan with patient at the bedside.   Disposition Plan: Home when medically stable.  Time spent: 15 minutes.   LOS: 7 days  Wilson Singer Pager [phone]  07/31/2012, 10:35 AM

## 2012-08-01 LAB — BASIC METABOLIC PANEL
BUN: 34 mg/dL — ABNORMAL HIGH (ref 6–23)
Creatinine, Ser: 3.39 mg/dL — ABNORMAL HIGH (ref 0.50–1.35)
GFR calc Af Amer: 23 mL/min — ABNORMAL LOW (ref >90)
GFR calc non Af Amer: 20 mL/min — ABNORMAL LOW (ref >90)
Potassium: 3.7 mEq/L (ref 3.5–5.1)

## 2012-08-01 LAB — GLUCOSE, CAPILLARY: Glucose-Capillary: 115 mg/dL — ABNORMAL HIGH (ref 70–99)

## 2012-08-01 NOTE — Progress Notes (Signed)
Juan Freeman:[id number] DOB: 1965/06/22 DOA: 07/24/2012 PCP: No PCP Per Patient   Subjective: This man's renal function is now improving slowly. I think is due to a combination of nephrotoxic medications including vancomycin, ACE inhibitor, intravenous Lasix and metformin. Appreciate nephrology input. He does appear to feel somewhat better today and he is smiling for the first time since this admission.          Physical Exam: Blood pressure 120/71, pulse 77, temperature 98.9 F (37.2 C), temperature source Oral, resp. rate 18, height 5\' 8"  (1.727 m), weight 125.51 kg (276 lb 11.2 oz), SpO2 96.00%. He looks systemically well. Is not toxic or septic. Heart sounds are present without murmurs or sounds. Lung fields are clear. He has bilateral lower leg cellulitis, which is improving. Both his lower legs are bandaged, he says there is raw skin underneath. He does have pitting edema of both his lower legs. He is alert and orientated. His affect is rather depressed but he appears to be smiling today.   Investigations:     Basic Metabolic Panel:  Recent Labs  81/19/14 0511 07/31/12 0509 08/01/12 0625  NA 137 139 138  K 3.4* 3.6 3.7  CL 100 102 103  CO2 25 25 25   GLUCOSE 120* 108* 113*  BUN 40* 37* 34*  CREATININE 3.67* 3.59* 3.39*  CALCIUM 8.9 8.8 8.6  PHOS  --  4.5  --       CBC:  Recent Labs  07/31/12 0509  WBC 9.2  HGB 11.9*  HCT 35.3*  MCV 91.9  PLT 256    No results found.    Medications: I have reviewed the patient's current medications.  Impression: 1. Bilateral leg cellulitis, lower legs, improving. 2. Newly diagnosed uncontrolled type 2 diabetes mellitus. 3. Hypertension. 4. Morbid Obesity. 5. Diastolic dysfunction with no evidence of heart failure. BNP is entirely normal. 6. Acute renal failure, improving, slowly. 7. Depression. Started antidepressants.     Plan: 1. Continue with recommendations from nephrology for IV fluids and IV  Lasix.   Consultants:  Wound care consult.   Procedures:  Echocardiogram: ------------------------------------------------------------ Study Conclusions  - Left ventricle: The cavity size was normal. There was mild concentric hypertrophy. Systolic function was normal. The estimated ejection fraction was in the range of 60% to 65%. Wall motion was normal; there were no regional wall motion abnormalities. Features are consistent with a pseudonormal left ventricular filling pattern, with concomitant abnormal relaxation and increased filling pressure (grade 2 diastolic dysfunction). There was no evidence of elevated ventricular filling pressure by Doppler parameters. - Mitral valve: Trivial regurgitation. - Left atrium: The atrium was mildly dilated. - Tricuspid valve: Trivial regurgitation. - Pulmonary arteries: Systolic pressure could not be accurately estimated. - Inferior vena cava: Not visualized. Unable to estimate CVP. - Pericardium, extracardiac: There was no pericardial effusion. Impressions:  - No prior study for comparison. Normal LV chamber size with mild LVH and LVEF 60-65%. Grade 2 diastolic dysfunction with normal filling pressures, mild left atrial enlargement. Aortic valve not well seen. Trivial mitral and tricuspid regurgitation. Unable to assess PASP or CVP. Transthoracic echocardiography. M-mode, complete 2D, spectral Doppler, and color Doppler. Height: Height: 172.7cm. Height: 68in. Weight: Weight: 128.8kg. Weight: 283.4lb. Body mass index: BMI: 43.2kg/m^2. Body surface area: BSA: 2.41m^2. Patient status: Inpatient. Location: Bedside.   Antibiotics:  Intravenous Zosyn started 07/25/2012.  Intravenous vancomycin started 07/25/2012. Vancomycin was stopped 07/27/2012.  Code Status: Full code.  Family Communication: Discussed plan with patient at the bedside.   Disposition Plan: Home when medically stable.  Time spent: 15  minutes.   LOS: 8 days   Wilson Singer Pager [phone]  08/01/2012, 10:24 AM

## 2012-08-01 NOTE — Progress Notes (Signed)
Juan Freeman  MRN: [id number]  DOB/AGE: [birth year] 47 y.o.  Primary Care Physician:No PCP Per Patient  Admit date: 07/24/2012  Chief Complaint:  Chief Complaint  Patient presents with  . Leg Swelling  . Cellulitis    S-Pt presented on  07/24/2012 with 47  Chief Complaint  Patient presents with  . Leg Swelling  . Cellulitis  .    Pt today feels better    Pt offers NO c/o chest pain/ NO dyspnea.  Meds  . amLODipine  10 mg Oral Daily  . citalopram  20 mg Oral Daily  . enoxaparin (LOVENOX) injection  40 mg Subcutaneous Q24H  . furosemide  60 mg Intravenous BID  . glipiZIDE  5 mg Oral QAC breakfast  . insulin aspart  0-20 Units Subcutaneous TID WC  . insulin aspart  0-5 Units Subcutaneous QHS  . insulin detemir  10 Units Subcutaneous QHS  . piperacillin-tazobactam (ZOSYN)  IV  3.375 g Intravenous Q8H  . sodium chloride  3 mL Intravenous Q12H     Physical Exam: Vital signs in last 24 hours: Temp:  [98.3 F (36.8 C)-99 F (37.2 C)] 98.9 F (37.2 C) (07/16 0542) Pulse Rate:  [72-84] 77 (07/16 0542) Resp:  [18] 18 (07/16 0542) BP: (109-121)/(51-78) 120/71 mmHg (07/16 0542) SpO2:  [96 %-98 %] 96 % (07/16 0542) Weight:  [276 lb 11.2 oz (125.51 kg)] 276 lb 11.2 oz (125.51 kg) (07/16 0542) Weight change: -9.6 oz (-0.272 kg) Last BM Date: 07/31/12  Intake/Output from previous day: 07/15 0701 - 07/16 0700 In: 4577.5 [P.O.:720; I.V.:3657.5; IV Piggyback:200] Out: 4200 [Urine:4200]     Physical Exam: General- pt is awake,alert, oriented to time place and person Resp- No acute REsp distress, CTA B/L NO Rhonchi CVS- S1S2 regular in rate and rhythm GIT- BS+, soft, NT, ND EXT- 2+LE Edema, NO Cyanosis   Lab Results: CBC  Recent Labs  07/31/12 0509  WBC 9.2  HGB 11.9*  HCT 35.3*  PLT 256    BMET  Recent Labs  07/31/12 0509 08/01/12 0625  NA 139 138  K 3.6 3.7  CL 102 103  CO2 25 25  GLUCOSE 108* 113*  BUN 37* 34*  CREATININE 3.59* 3.39*  CALCIUM 8.8  8.6   Creat trend  2014 1.18==>3.74==>3.39    Lab Results  Component Value Date   CALCIUM 8.6 08/01/2012   PHOS 4.5 07/31/2012        Spot Protein/ Crea ratio 33/162=203 millgrams  Impression: 1)Renal AKI secondary to ATN/ prerenal/AIN  AKI secondary to Multiple factors  NSAIDS + ACE + Hypovolemia( Uncontrolled DM Causes osmotic diuresis)+ High vanco  Pt FEna less than 1 but adequate output - Most likely Non Oliguric ATN NON Oliguric ATN Creat now trending down.   2)HTN  Target Organ damage  CKD  LVH  Medication-  On Calcium Channel Blockers   3)Anemia HGb at goal (9--11)   4)DM- uncontrolled  Being managed by Primary team   5)ID- cellulitis  On IV ABX  Clinically better   6)Electrolytes   Hypokalemic - now normal Sec to Diuretics  Hyponatremic -now normal  Was Secondary to Hypovolemia + hyperglycemia   7)Acid base  Co2 at goal      Plan:  Will continue current tx an plan Will follow bmet         Joselyne Spake S 08/01/2012, 8:26 AM

## 2012-08-01 NOTE — Evaluation (Signed)
Physical Therapy Evaluation Patient Details Name: Juan Freeman MRN: [id number] DOB: 1965-11-29 Today's Date: 08/01/2012 Time: 4098-1191 PT Time Calculation (min): 22 min  PT Assessment / Plan / Recommendation History of Present Illness   Pt is a 47 yo male admitted to [facility] for cellulitis.  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  Clinical Impression  Pt is at baseline of functioning and needs no skilled PT services    PT Assessment  Patent does not need any further PT services    Follow Up Recommendations  No PT follow up    Does the patient have the potential to tolerate intense rehabilitation    N/A  Barriers to Discharge  none      Equipment Recommendations    none   Recommendations for Other Services   none  Frequency   n/A   Precautions / Restrictions Precautions Precautions: None Restrictions Weight Bearing Restrictions: No   Pertinent Vitals/Pain None stated      Mobility  Bed Mobility Bed Mobility: Supine to Sit Supine to Sit: 7: Independent Transfers Transfers: Sit to Stand Sit to Stand: 7: Independent Ambulation/Gait Ambulation/Gait Assistance: 7: Independent Ambulation Distance (Feet): 500 Feet Assistive device: None Gait Pattern: Within Functional Limits Gait velocity: normal Stairs: No         PT Diagnosis:   none PT Problem List:  none PT Treatment Interventions:   none    PT Goals(Current goals can be found in the care plan section)  no goals as pt does not need skilled PT  Visit Information  Last PT Received On: 08/01/12       Prior Functioning  Home Living Family/patient expects to be discharged to:: Private residence Living Arrangements: Alone Type of Home: House Home Access: Stairs to enter Secretary/administrator of Steps: 2 Entrance Stairs-Rails: Right Home Equipment: None Prior Function Level of Independence: Independent Communication Communication: No difficulties    Cognition   Cognition Arousal/Alertness: Awake/alert Behavior During Therapy: WFL for tasks assessed/performed Overall Cognitive Status: Within Functional Limits for tasks assessed    Extremity/Trunk Assessment Lower Extremity Assessment Lower Extremity Assessment: Overall WFL for tasks assessed   Balance Balance Balance Assessed: No  End of Session PT - End of Session Equipment Utilized During Treatment: Gait belt Activity Tolerance: Patient tolerated treatment well  GP     Gordy Goar,CINDY 08/01/2012, 11:52 AM

## 2012-08-02 ENCOUNTER — Inpatient Hospital Stay (HOSPITAL_COMMUNITY): Payer: MEDICAID

## 2012-08-02 LAB — URINALYSIS, ROUTINE W REFLEX MICROSCOPIC
Bilirubin Urine: NEGATIVE
Glucose, UA: NEGATIVE mg/dL
Specific Gravity, Urine: 1.01 (ref 1.005–1.030)
Urobilinogen, UA: 0.2 mg/dL (ref 0.0–1.0)

## 2012-08-02 LAB — BASIC METABOLIC PANEL
Chloride: 101 mEq/L (ref 96–112)
GFR calc Af Amer: 24 mL/min — ABNORMAL LOW (ref >90)
Potassium: 3.7 mEq/L (ref 3.5–5.1)

## 2012-08-02 LAB — GLUCOSE, CAPILLARY: Glucose-Capillary: 101 mg/dL — ABNORMAL HIGH (ref 70–99)

## 2012-08-02 LAB — URINE MICROSCOPIC-ADD ON

## 2012-08-02 LAB — COMPLEMENT, TOTAL: Compl, Total (CH50): >60 U/mL — ABNORMAL HIGH (ref 31–60)

## 2012-08-02 MED ORDER — ENSURE COMPLETE PO LIQD
237.0000 mL | Freq: Two times a day (BID) | ORAL | Status: DC
  Administered 2012-08-03 – 2012-08-06 (×3): 237 mL via ORAL

## 2012-08-02 NOTE — Progress Notes (Signed)
Subjective: Interval History:Complains of poor appetite and nausea.. Presently patient denies any difficulty in breathinng. Patient presently does not have any vomiting. Patient states that he's feeling better very slowly but has some weakness.  Objective: Vital signs in last 24 hours: Temp:  [98.7 F (37.1 C)-100.7 F (38.2 C)] 100 F (37.8 C) (07/17 0550) Pulse Rate:  [78-81] 78 (07/17 0550) Resp:  [18] 18 (07/16 1356) BP: (115-130)/(74-80) 123/80 mmHg (07/17 0550) SpO2:  [95 %-98 %] 96 % (07/17 0550) Weight change:   Intake/Output from previous day: 07/16 0701 - 07/17 0700 In: 840 [P.O.:840] Out: 2700 [Urine:2700] Intake/Output this shift:    General appearance: alert, cooperative and no distress Resp: clear to auscultation bilaterally Cardio: regular rate and rhythm, S1, S2 normal, no murmur, click, rub or gallop GI: soft, non-tender; bowel sounds normal; no masses,  no organomegaly Extremities: edema Bilateral 2+ edema and also erythema. A part of his leg is dressed.  Lab Results:  Recent Labs  07/31/12 0509  WBC 9.2  HGB 11.9*  HCT 35.3*  PLT 256   BMET:   Recent Labs  08/01/12 0625 08/02/12 0604  NA 138 136  K 3.7 3.7  CL 103 101  CO2 25 23  GLUCOSE 113* 106*  BUN 34* 31*  CREATININE 3.39* 3.23*  CALCIUM 8.6 8.8   No results found for this basename: PTH,  in the last 72 hours Iron Studies: No results found for this basename: IRON, TIBC, TRANSFERRIN, FERRITIN,  in the last 72 hours  Studies/Results: No results found.  I have reviewed the patient's current medications.  Assessment/Plan: Problem #1 acute kidney injury his BUN is 31 and creatinine is 3.23 renal function seems to be improving. Presently is none oliguric. Problem #2 hypokalemia: His  potassium is 3.7 corrected.  Problem #3 hyponatremia sodium 136 has corrected. Problem #4 hypertension his blood pressure seems to be reasonably controlled Problem #5 diabetes Problem #6 cellulitis of  his leg bilateral patient on antibiotics. Patient is a febrile his white blood cell count is normal.. Problem #7 metabolic bone disease calcium was in acceptable range. His phosphorus is with in range Problem #8 acid-base balance he CO2 is was in acceptable range. Plan: Continue his present management We'll check his basic metabolic panel in the morning.   LOS: 9 days   Aimee Heldman S 08/02/2012,7:09 AM

## 2012-08-02 NOTE — Progress Notes (Signed)
ANTIBIOTIC CONSULT NOTE   Pharmacy Consult for Zosyn Indication: bilateral LE cellulitis (L > R)  No Known Allergies  Patient Measurements: Height: 5\' 8"  (172.7 cm) Weight:  (pt refused to get into bed) IBW/kg (Calculated) : 68.4 Adjusted Body Weight: 92Kg Measured Body Weight 7/16: 125.5kg  Vital Signs: Temp: 100 F (37.8 C) (07/17 0550) Temp src: Oral (07/17 0550) BP: 123/80 mmHg (07/17 0550) Pulse Rate: 78 (07/17 0550) Intake/Output from previous day: 07/16 0701 - 07/17 0700 In: 840 [P.O.:840] Out: 2700 [Urine:2700] Intake/Output from this shift: Total I/O In: 350 [P.O.:350] Out: 975 [Urine:975]  Labs:  Recent Labs  07/31/12 0509 08/01/12 0625 08/02/12 0604  WBC 9.2  --   --   HGB 11.9*  --   --   PLT 256  --   --   CREATININE 3.59* 3.39* 3.23*   Estimated Creatinine Clearance: 35.7 ml/min (by C-G formula based on Cr of 3.23). No results found for this basename: VANCOTROUGH, VANCOPEAK, VANCORANDOM, GENTTROUGH, GENTPEAK, GENTRANDOM, TOBRATROUGH, TOBRAPEAK, TOBRARND, AMIKACINPEAK, AMIKACINTROU, AMIKACIN,  in the last 72 hours  Microbiology: No results found for this or any previous visit (from the past 720 hour(s)).  Medical History: Past Medical History  Diagnosis Date  . Hypertension    Medications:  Scheduled:  . amLODipine  10 mg Oral Daily  . citalopram  20 mg Oral Daily  . enoxaparin (LOVENOX) injection  40 mg Subcutaneous Q24H  . furosemide  60 mg Intravenous BID  . glipiZIDE  5 mg Oral QAC breakfast  . insulin aspart  0-20 Units Subcutaneous TID WC  . insulin aspart  0-5 Units Subcutaneous QHS  . insulin detemir  10 Units Subcutaneous QHS  . piperacillin-tazobactam (ZOSYN)  IV  3.375 g Intravenous Q8H  . sodium chloride  3 mL Intravenous Q12H   Assessment: 47yo morbidly obese male with h/o LE cellulitis currently on day#9 Zosyn.  Vancomycin was stopped due to ARF.  Renal function is slowly improving.   Goal of Therapy:  Eradicate  infection.  Plan: Continue Zosyn 3.375gm IV q8hrs to be infused over 4 hours (for clcr > 20) Monitor labs, renal fxn, and cultures per protocol guidelines Duration of therapy per MD  Elson Clan 08/02/2012,10:07 AM

## 2012-08-02 NOTE — Progress Notes (Signed)
[facility] PROGRESS NOTE  Juan Freeman ZOX:[id number] DOB: 17-Sep-1965 DOA: 07/24/2012 PCP: No PCP Per Patient  Assessment/Plan: 1. Acute renal failure. Creatinine appears to be plateauing. Appreciate nephrology assistance. Continue current treatments with IV fluids and Lasix. Renal function is very slow to improve. Will discuss with nephrology regarding further plans. He still has significant lower extremity edema and would benefit from further diuresis.  2. Type 2 diabetes. Continue with sliding scale insulin and glipizide. Blood sugars are stable 3. Bilateral lower extremity cellulitis. Likely has an element of venous stasis. He is currently on Zosyn. Continue to diurese and keep legs elevated. Ejection fraction is intact. He does have some diastolic dysfunction. Appears to be slowly improving with current treatment 4. Depression, started on Celexa 5. Morbid obesity 6. Fever. Unclear etiology. Check urinalysis, chest x-ray blood cultures.  Code Status: full code Family Communication: discussed with patient, no family at bedside Disposition Plan: discharge home once improved   Consultants:  Nephrology  Wound care  Procedures:  none  Antibiotics:  Zosyn 7/9 -   Vancomycin 7/9 - 7/11  HPI/Subjective: Feeling a little better today.  Legs do not feel as swollen.  No cough or diarrhea.  Objective: Filed Vitals:   08/01/12 1356 08/01/12 2052 08/02/12 0550 08/02/12 1347  BP: 130/80 115/74 123/80 126/77  Pulse: 81 79 78 79  Temp: 98.7 F (37.1 C) 100.7 F (38.2 C) 100 F (37.8 C) 98.8 F (37.1 C)  TempSrc: Oral Oral Oral Oral  Resp: 18   18  Height:      Weight:      SpO2: 98% 95% 96% 96%    Intake/Output Summary (Last 24 hours) at 08/02/12 1645 Last data filed at 08/02/12 1347  Gross per 24 hour  Intake    990 ml  Output   3675 ml  Net  -2685 ml   Filed Weights   07/30/12 0758 07/31/12 0554 08/01/12 0542  Weight: 125.782 kg (277 lb 4.8 oz) 125.329 kg  (276 lb 4.8 oz) 125.51 kg (276 lb 11.2 oz)    Exam:   General:  NAD  Cardiovascular: S1, S2 RRR  Respiratory: CTA B  Abdomen: soft, nt, nd, bs+  Musculoskeletal: 1+ edema b/l. Gauze applied to lower legs bilaterally   Data Reviewed: Basic Metabolic Panel:  Recent Labs Lab 07/29/12 0557 07/30/12 0511 07/31/12 0509 08/01/12 0625 08/02/12 0604  NA 134* 137 139 138 136  K 3.3* 3.4* 3.6 3.7 3.7  CL 98 100 102 103 101  CO2 23 25 25 25 23   GLUCOSE 138* 120* 108* 113* 106*  BUN 37* 40* 37* 34* 31*  CREATININE 3.74* 3.67* 3.59* 3.39* 3.23*  CALCIUM 9.1 8.9 8.8 8.6 8.8  PHOS  --   --  4.5  --   --    Liver Function Tests: No results found for this basename: AST, ALT, ALKPHOS, BILITOT, PROT, ALBUMIN,  in the last 168 hours No results found for this basename: LIPASE, AMYLASE,  in the last 168 hours No results found for this basename: AMMONIA,  in the last 168 hours CBC:  Recent Labs Lab 07/28/12 0620 07/31/12 0509  WBC 10.7* 9.2  HGB 13.2 11.9*  HCT 39.7 35.3*  MCV 91.9 91.9  PLT 289 256   Cardiac Enzymes: No results found for this basename: CKTOTAL, CKMB, CKMBINDEX, TROPONINI,  in the last 168 hours BNP (last 3 results)  Recent Labs  07/24/12 1429 07/27/12 0557  PROBNP 28.5 29.2   CBG:  Recent  Labs Lab 08/01/12 1630 08/01/12 2049 08/02/12 0715 08/02/12 1117 08/02/12 1612  GLUCAP 76 115* 101* 95 115*    No results found for this or any previous visit (from the past 240 hour(s)).   Studies: No results found.  Scheduled Meds: . amLODipine  10 mg Oral Daily  . citalopram  20 mg Oral Daily  . enoxaparin (LOVENOX) injection  40 mg Subcutaneous Q24H  . furosemide  60 mg Intravenous BID  . glipiZIDE  5 mg Oral QAC breakfast  . insulin aspart  0-20 Units Subcutaneous TID WC  . insulin aspart  0-5 Units Subcutaneous QHS  . insulin detemir  10 Units Subcutaneous QHS  . piperacillin-tazobactam (ZOSYN)  IV  3.375 g Intravenous Q8H  . sodium chloride  3  mL Intravenous Q12H   Continuous Infusions: . 0.9 % NaCl with KCl 20 mEq / L 150 mL/hr at 08/02/12 1030    Active Problems:   Cellulitis   Hypertension   Obesity   Hyperglycemia   Acute renal failure   Pedal edema   Diastolic dysfunction    Time spent:    MEMON,JEHANZEB  [facility] Pager [phone]. If 7PM-7AM, please contact night-coverage at [email or web address], password [email or web address] 08/02/2012, 4:45 PM  LOS: 9 days

## 2012-08-02 NOTE — Progress Notes (Signed)
Nutrition Follow-up   INTERVENTION:  Continue ProStat 30 ml TID (each 30 ml provides 100 kcal, 15 gr protein) due to increased protein needs  Add Glucerna BID between meals  RD will continue to follow for nutrition care  NUTRITION DIAGNOSIS: Inadequate oral intake related to decreased appetite AEB pt reported poor appetite and po intake last few days  Goal: Pt to meet >/= 90% of their estimated nutrition needs; not met    Monitor:  Po intake, labs, I/O's, wt trends, skin assessments and diet compliance  47 y.o. male  ASSESSMENT: Pt is sitting up in chair covered with spread but shaking and says he's cold. Reports poor appetite and po intake last couple days. Antibiotic treatment for bilateral cellulitis continues. Assessed by wound care nurse. He is also receiving diuretic. His wt has decreased 8# (3.6 kg) x 8 days.  Height: Ht Readings from Last 1 Encounters:  07/24/12 5\' 8"  (1.727 m)    Weight: Wt Readings from Last 1 Encounters:  08/01/12 276 lb 11.2 oz (125.51 kg)    Ideal Body Weight: 154# (70 kg)  % Ideal Body Weight: 180%  Wt Readings from Last 10 Encounters:  08/01/12 276 lb 11.2 oz (125.51 kg)    Usual Body Weight: 300#  % Usual Body Weight: 92%  BMI:  Body mass index is 42.08 kg/(m^2). Extreme Obesity Class III  Estimated Nutritional Needs: Kcal: 1800-2000 Protein: 105-126 gr Fluid: > 2000 ml/day  Skin: cellulitis, bilateral LE  Diet Order: Carb Control  EDUCATION NEEDS: -Education needs addressed   Intake/Output Summary (Last 24 hours) at 08/02/12 1706 Last data filed at 08/02/12 1347  Gross per 24 hour  Intake    990 ml  Output   3675 ml  Net  -2685 ml    Last BM: 07/31/12  Labs:   Recent Labs Lab 07/30/12 0511 07/31/12 0509 08/01/12 0625 08/02/12 0604  NA 137 139 138 136  K 3.4* 3.6 3.7 3.7  CL 100 102 103 101  CO2 25 25 25 23   BUN 40* 37* 34* 31*  CREATININE 3.67* 3.59* 3.39* 3.23*  CALCIUM 8.9 8.8 8.6 8.8  PHOS  --   4.5  --   --   GLUCOSE 120* 108* 113* 106*    CBG (last 3)   Recent Labs  08/02/12 0715 08/02/12 1117 08/02/12 1612  GLUCAP 101* 95 115*    Scheduled Meds: . amLODipine  10 mg Oral Daily  . citalopram  20 mg Oral Daily  . enoxaparin (LOVENOX) injection  40 mg Subcutaneous Q24H  . [START ON 08/03/2012] feeding supplement  237 mL Oral BID BM  . furosemide  60 mg Intravenous BID  . glipiZIDE  5 mg Oral QAC breakfast  . insulin aspart  0-20 Units Subcutaneous TID WC  . insulin aspart  0-5 Units Subcutaneous QHS  . insulin detemir  10 Units Subcutaneous QHS  . piperacillin-tazobactam (ZOSYN)  IV  3.375 g Intravenous Q8H  . sodium chloride  3 mL Intravenous Q12H    Continuous Infusions: . 0.9 % NaCl with KCl 20 mEq / L 150 mL/hr at 08/02/12 1030    Past Medical History  Diagnosis Date  . Hypertension     History reviewed. No pertinent past surgical history.  Royann Shivers MS,RD,LDN,CSG Office: [phone] Pager: [phone]

## 2012-08-02 NOTE — Progress Notes (Signed)
RN and [facility] attempted one time each to insert new PIV and failed. Consider PICC.

## 2012-08-03 ENCOUNTER — Encounter (HOSPITAL_COMMUNITY): Payer: Self-pay

## 2012-08-03 ENCOUNTER — Inpatient Hospital Stay (HOSPITAL_COMMUNITY): Payer: MEDICAID

## 2012-08-03 LAB — BASIC METABOLIC PANEL
BUN: 31 mg/dL — ABNORMAL HIGH (ref 6–23)
CO2: 25 mEq/L (ref 19–32)
Chloride: 101 mEq/L (ref 96–112)
Creatinine, Ser: 2.99 mg/dL — ABNORMAL HIGH (ref 0.50–1.35)

## 2012-08-03 LAB — CBC
HCT: 35.1 % — ABNORMAL LOW (ref 39.0–52.0)
MCHC: 33.9 g/dL (ref 30.0–36.0)
MCV: 90.2 fL (ref 78.0–100.0)
RDW: 13.7 % (ref 11.5–15.5)
WBC: 4 10*3/uL (ref 4.0–10.5)

## 2012-08-03 MED ORDER — CLINDAMYCIN PHOSPHATE 600 MG/50ML IV SOLN
600.0000 mg | Freq: Three times a day (TID) | INTRAVENOUS | Status: DC
  Administered 2012-08-03 – 2012-08-06 (×9): 600 mg via INTRAVENOUS
  Filled 2012-08-03 (×13): qty 50

## 2012-08-03 MED ORDER — ALPRAZOLAM 0.5 MG PO TABS
0.5000 mg | ORAL_TABLET | Freq: Once | ORAL | Status: AC
  Administered 2012-08-03: 0.5 mg via ORAL
  Filled 2012-08-03: qty 1

## 2012-08-03 MED ORDER — SODIUM CHLORIDE 0.9 % IJ SOLN
10.0000 mL | Freq: Two times a day (BID) | INTRAMUSCULAR | Status: DC
  Administered 2012-08-03 – 2012-08-06 (×2): 10 mL

## 2012-08-03 MED ORDER — SODIUM CHLORIDE 0.9 % IJ SOLN
10.0000 mL | INTRAMUSCULAR | Status: DC | PRN
  Administered 2012-08-06: 10 mL

## 2012-08-03 NOTE — Progress Notes (Signed)
[facility] PROGRESS NOTE  Juan Freeman NWG:[id number] DOB: January 31, 1965 DOA: 07/24/2012 PCP: No PCP Per Patient  Assessment/Plan: 1. Acute renal failure. Creatinine appears to be plateauing. Appreciate nephrology assistance. Continue current treatments with IV fluids and Lasix. Renal function is very slow to improve. He still has significant lower extremity edema and would benefit from further diuresis.  2. Type 2 diabetes. Continue with sliding scale insulin and glipizide. Blood sugars are stable 3. Bilateral lower extremity cellulitis. Likely has an element of venous stasis. He is currently on Zosyn. He continues to have fevers and therefore Zosyn will be changed to clindamycin. Continue to diurese and keep legs elevated. Ejection fraction is intact. He does have some diastolic dysfunction. Appears to be slowly improving with current treatment 4. Depression, started on Celexa 5. Morbid obesity 6. Fever. Patient continues to spike fevers. Etiology is not clear. Chest x-ray is unremarkable, urinalysis does not show any signs of infection. Blood cultures were sent yesterday and will be followed up. We will change antibiotics to clindamycin for added MRSA coverage. Check venous Dopplers to rule out underlying DVT  Code Status: full code Family Communication: discussed with patient, no family at bedside Disposition Plan: discharge home once improved   Consultants:  Nephrology  Wound care  Procedures:  none  Antibiotics:  Zosyn 7/9 -7/18   Vancomycin 7/9 - 7/11  Clindamycin 7/18  HPI/Subjective: Feeling a little better today. Had 2-3 bowel movements yesterday. Described as warranted. No dysuria or cough  Objective: Filed Vitals:   08/02/12 1347 08/02/12 2129 08/03/12 0030 08/03/12 0614  BP: 126/77 126/76  127/81  Pulse: 79 64  67  Temp: 98.8 F (37.1 C) 99.4 F (37.4 C) 101.4 F (38.6 C) 98.2 F (36.8 C)  TempSrc: Oral Oral Oral Oral  Resp: 18 20  20   Height:       Weight:    124.875 kg (275 lb 4.8 oz)  SpO2: 96% 97%  97%    Intake/Output Summary (Last 24 hours) at 08/03/12 1450 Last data filed at 08/03/12 1055  Gross per 24 hour  Intake 5847.5 ml  Output    750 ml  Net 5097.5 ml   Filed Weights   07/31/12 0554 08/01/12 0542 08/03/12 0614  Weight: 125.329 kg (276 lb 4.8 oz) 125.51 kg (276 lb 11.2 oz) 124.875 kg (275 lb 4.8 oz)    Exam:   General:  NAD  Cardiovascular: S1, S2 RRR  Respiratory: CTA B  Abdomen: soft, nt, nd, bs+  Musculoskeletal: 1+ edema b/l. Large areas of erythema over lower extremities bilaterally. Left Lower extremity has area of crusting/scaling over anterior surface. No abscess could be palpated in either extremity. Both legs are warm to touch. No drainage noted.  Data Reviewed: Basic Metabolic Panel:  Recent Labs Lab 07/30/12 0511 07/31/12 0509 08/01/12 0625 08/02/12 0604 08/03/12 0654  NA 137 139 138 136 135  K 3.4* 3.6 3.7 3.7 3.4*  CL 100 102 103 101 101  CO2 25 25 25 23 25   GLUCOSE 120* 108* 113* 106* 95  BUN 40* 37* 34* 31* 31*  CREATININE 3.67* 3.59* 3.39* 3.23* 2.99*  CALCIUM 8.9 8.8 8.6 8.8 9.2  PHOS  --  4.5  --   --   --    Liver Function Tests: No results found for this basename: AST, ALT, ALKPHOS, BILITOT, PROT, ALBUMIN,  in the last 168 hours No results found for this basename: LIPASE, AMYLASE,  in the last 168 hours No results found  for this basename: AMMONIA,  in the last 168 hours CBC:  Recent Labs Lab 07/28/12 0620 07/31/12 0509 08/03/12 0654  WBC 10.7* 9.2 4.0  HGB 13.2 11.9* 11.9*  HCT 39.7 35.3* 35.1*  MCV 91.9 91.9 90.2  PLT 289 256 243   Cardiac Enzymes: No results found for this basename: CKTOTAL, CKMB, CKMBINDEX, TROPONINI,  in the last 168 hours BNP (last 3 results)  Recent Labs  07/24/12 1429 07/27/12 0557  PROBNP 28.5 29.2   CBG:  Recent Labs Lab 08/02/12 0715 08/02/12 1117 08/02/12 1612 08/03/12 0853 08/03/12 1232  GLUCAP 101* 95 115* 145* 97     No results found for this or any previous visit (from the past 240 hour(s)).   Studies: Dg Chest 2 View  08/02/2012   *RADIOLOGY REPORT*  Clinical Data: Fever.  CHEST - 2 VIEW  Comparison: July 24, 2012.  Findings: Cardiomediastinal silhouette appears normal.  No pleural effusion or pneumothorax is noted.  No acute pulmonary disease is noted.  Bony thorax is intact.  IMPRESSION: No acute cardiopulmonary abnormality seen.   Original Report Authenticated By: Lupita Raider.,  M.D.    Scheduled Meds: . amLODipine  10 mg Oral Daily  . citalopram  20 mg Oral Daily  . enoxaparin (LOVENOX) injection  40 mg Subcutaneous Q24H  . feeding supplement  237 mL Oral BID BM  . furosemide  60 mg Intravenous BID  . glipiZIDE  5 mg Oral QAC breakfast  . insulin aspart  0-20 Units Subcutaneous TID WC  . insulin aspart  0-5 Units Subcutaneous QHS  . insulin detemir  10 Units Subcutaneous QHS  . piperacillin-tazobactam (ZOSYN)  IV  3.375 g Intravenous Q8H  . sodium chloride  3 mL Intravenous Q12H   Continuous Infusions: . 0.9 % NaCl with KCl 20 mEq / L 150 mL/hr at 08/02/12 1030    Active Problems:   Cellulitis   Hypertension   Obesity   Hyperglycemia   Acute renal failure   Pedal edema   Diastolic dysfunction    Time spent:    Coree Riester  [facility] Pager [phone]. If 7PM-7AM, please contact night-coverage at [email or web address], password [facility] 08/03/2012, 2:50 PM  LOS: 10 days

## 2012-08-03 NOTE — Progress Notes (Signed)
Subjective: Interval History:Complains of poor appetite but nausea orvomiting Presently patient denies any difficulty in breathinng. Patient states that he's feeling better very slowly but has some weakness.  Objective: Vital signs in last 24 hours: Temp:  [98.2 F (36.8 C)-101.4 F (38.6 C)] 98.2 F (36.8 C) (07/18 0865) Pulse Rate:  [64-79] 67 (07/18 0614) Resp:  [18-20] 20 (07/18 0614) BP: (126-127)/(76-81) 127/81 mmHg (07/18 0614) SpO2:  [96 %-97 %] 97 % (07/18 0614) Weight:  [124.875 kg (275 lb 4.8 oz)] 124.875 kg (275 lb 4.8 oz) (07/18 7846) Weight change:   Intake/Output from previous day: 07/17 0701 - 07/18 0700 In: 6597.5 [P.O.:1050; I.V.:5547.5] Out: 3000 [Urine:3000] Intake/Output this shift:    General appearance: alert, cooperative and no distress Resp: clear to auscultation bilaterally Cardio: regular rate and rhythm, S1, S2 normal, no murmur, click, rub or gallop GI: soft, non-tender; bowel sounds normal; no masses,  no organomegaly Extremities: edema Bilateral 2+ edema and also erythema. A part of his leg is dressed.  Lab Results:  Recent Labs  08/03/12 0654  WBC 4.0  HGB 11.9*  HCT 35.1*  PLT 243   BMET:   Recent Labs  08/02/12 0604 08/03/12 0654  NA 136 135  K 3.7 3.4*  CL 101 101  CO2 23 25  GLUCOSE 106* 95  BUN 31* 31*  CREATININE 3.23* 2.99*  CALCIUM 8.8 9.2   No results found for this basename: PTH,  in the last 72 hours Iron Studies: No results found for this basename: IRON, TIBC, TRANSFERRIN, FERRITIN,  in the last 72 hours  Studies/Results: Dg Chest 2 View  08/02/2012   *RADIOLOGY REPORT*  Clinical Data: Fever.  CHEST - 2 VIEW  Comparison: July 24, 2012.  Findings: Cardiomediastinal silhouette appears normal.  No pleural effusion or pneumothorax is noted.  No acute pulmonary disease is noted.  Bony thorax is intact.  IMPRESSION: No acute cardiopulmonary abnormality seen.   Original Report Authenticated By: Lupita Raider.,  M.D.     I have reviewed the patient's current medications.  Assessment/Plan: Problem #1 acute kidney injury his BUN is 31 and creatinine is 2.99 renal function is  improving. Presently is none oliguric. Problem #2 hypokalemia: His  potassium is 3.4  Declining possibly due to lack of iv access and discontinuation of his iv fluid with potassium since yesterday.  Problem #3 hyponatremia sodium 135 has corrected. Problem #4 hypertension his blood pressure seems to be reasonably controlled Problem #5 diabetes Problem #6 cellulitis of his leg bilateral patient on antibiotics. Patient is a febrile his white blood cell count is normal.. Problem #7 metabolic bone disease calcium was in acceptable range. His phosphorus is with in range Plan: Continue his present management including iv fluid with potassium after central line placement We'll check his basic metabolic panel in the morning.   LOS: 10 days   Necie Wilcoxson S 08/03/2012,8:06 AM

## 2012-08-04 DIAGNOSIS — R609 Edema, unspecified: Secondary | ICD-10-CM

## 2012-08-04 LAB — GLUCOSE, CAPILLARY
Glucose-Capillary: 105 mg/dL — ABNORMAL HIGH (ref 70–99)
Glucose-Capillary: 97 mg/dL (ref 70–99)

## 2012-08-04 LAB — BASIC METABOLIC PANEL
BUN: 31 mg/dL — ABNORMAL HIGH (ref 6–23)
CO2: 27 mEq/L (ref 19–32)
Chloride: 100 mEq/L (ref 96–112)
Creatinine, Ser: 2.79 mg/dL — ABNORMAL HIGH (ref 0.50–1.35)
Glucose, Bld: 102 mg/dL — ABNORMAL HIGH (ref 70–99)
Potassium: 3.5 mEq/L (ref 3.5–5.1)

## 2012-08-04 LAB — CBC
HCT: 35.7 % — ABNORMAL LOW (ref 39.0–52.0)
Hemoglobin: 11.9 g/dL — ABNORMAL LOW (ref 13.0–17.0)
MCHC: 33.3 g/dL (ref 30.0–36.0)
MCV: 91.3 fL (ref 78.0–100.0)
RDW: 14 % (ref 11.5–15.5)

## 2012-08-04 LAB — PHOSPHORUS: Phosphorus: 4 mg/dL (ref 2.3–4.6)

## 2012-08-04 NOTE — Progress Notes (Signed)
Subjective: Interval History:Apetite is better and no nausea or vomiting Presently patient denies any difficulty in breathinng. Patient states that he's feeling better very slowly but has some weakness.  Objective: Vital signs in last 24 hours: Temp:  [98.5 F (36.9 C)-99.7 F (37.6 C)] 98.5 F (36.9 C) (07/19 0500) Pulse Rate:  [72-77] 72 (07/19 0500) Resp:  [20] 20 (07/19 0500) BP: (125-126)/(82-83) 125/82 mmHg (07/19 0500) SpO2:  [97 %-98 %] 97 % (07/19 0500) Weight:  [124.985 kg (275 lb 8.7 oz)] 124.985 kg (275 lb 8.7 oz) (07/19 0500) Weight change: 0.11 kg (3.9 oz)  Intake/Output from previous day: 07/18 0701 - 07/19 0700 In: 6365 [P.O.:720; I.V.:5495; IV Piggyback:150] Out: 3175 [Urine:3175] Intake/Output this shift: Total I/O In: 520 [P.O.:520] Out: 700 [Urine:700]  General appearance: alert, cooperative and no distress Resp: clear to auscultation bilaterally Cardio: regular rate and rhythm, S1, S2 normal, no murmur, click, rub or gallop GI: soft, non-tender; bowel sounds normal; no masses,  no organomegaly Extremities: edema Bilateral 2+ edema and also erythema. A part of his leg is dressed.  Lab Results:  Recent Labs  08/03/12 0654 08/04/12 0642  WBC 4.0 6.6  HGB 11.9* 11.9*  HCT 35.1* 35.7*  PLT 243 244   BMET:   Recent Labs  08/03/12 0654 08/04/12 0642  NA 135 137  K 3.4* 3.5  CL 101 100  CO2 25 27  GLUCOSE 95 102*  BUN 31* 31*  CREATININE 2.99* 2.79*  CALCIUM 9.2 8.8   No results found for this basename: PTH,  in the last 72 hours Iron Studies: No results found for this basename: IRON, TIBC, TRANSFERRIN, FERRITIN,  in the last 72 hours  Studies/Results: Dg Chest 2 View  08/02/2012   *RADIOLOGY REPORT*  Clinical Data: Fever.  CHEST - 2 VIEW  Comparison: July 24, 2012.  Findings: Cardiomediastinal silhouette appears normal.  No pleural effusion or pneumothorax is noted.  No acute pulmonary disease is noted.  Bony thorax is intact.  IMPRESSION: No  acute cardiopulmonary abnormality seen.   Original Report Authenticated By: Lupita Raider.,  M.D.   US Venous Img Lower Bilateral  08/03/2012   *RADIOLOGY REPORT*  Clinical Data: Bilateral lower extremity edema and pain.  BILATERAL LOWER EXTREMITY VENOUS DUPLEX ULTRASOUND  Technique:  Gray-scale sonography with graded compression, as well as color Doppler and duplex ultrasound, were performed to evaluate the deep venous system of both lower extremities from the level of the common femoral vein through the popliteal and proximal calf veins.  Spectral Doppler was utilized to evaluate flow at rest and with distal augmentation maneuvers.  Comparison:  None.  Findings:  Normal compressibility of bilateral common femoral, superficial femoral, and popliteal veins is demonstrated, as well as the visualized proximal calf veins.  No filling defects to suggest DVT on grayscale or color Doppler imaging.  Doppler waveforms show normal direction of venous flow, normal respiratory phasicity and response to augmentation.  No evidence of superficial thrombophlebitis or abnormal fluid collection.  IMPRESSION: No evidence of deep vein thrombosis in either lower extremity.   Original Report Authenticated By: Irish Lack, M.D.   Dg Chest Port 1 View  08/03/2012   *RADIOLOGY REPORT*  Clinical Data: PICC line placement  PORTABLE CHEST - 1 VIEW  Comparison: 08/02/2012  Findings: The tip of the PICC line is 6.8 cm below the carina.  There is mild cardiac enlargement.  No pleural effusion or edema.  No airspace consolidation.  The visualized osseous structures are unremarkable.  IMPRESSION:  1.  Right arm PICC line tip is 6.8 cm below the carina.  Advise withdrawing by approximately 2 cm. 2.  Cardiac enlargement.   Original Report Authenticated By: Signa Kell, M.D.    I have reviewed the patient's current medications.  Assessment/Plan: Problem #1 acute kidney injury his BUN is 31 and creatinine is 2.79 renal function is   improving. Presently is none oliguric. Problem #2 hypokalemia: His  potassium is 3.5  On potassium supplement and improving.  Problem #3 hyponatremia sodium 135 has corrected. Problem #4 hypertension his blood pressure seems to be reasonably controlled Problem #5 diabetes Problem #6 cellulitis of his leg bilateral patient on antibiotics. Patient with low grad fever but his white blood cell count is normal.. Problem #7 metabolic bone disease calcium was in acceptable range. His phosphorus is with in range Plan: decrease iv fluid to 75 cc/hr We'll check his basic metabolic panel in the morning.   LOS: 11 days   Jemel Ono S 08/04/2012,10:41 AM

## 2012-08-04 NOTE — Progress Notes (Signed)
[facility] PROGRESS NOTE  Juan Freeman GNF:[id number] DOB: 07-20-1965 DOA: 07/24/2012 PCP: No PCP Per Patient  Assessment/Plan: 1. Acute renal failure. Creatinine appears to be plateauing. Appreciate nephrology assistance. Continue current treatments with IV fluids and Lasix. Renal function is very slow to improve. He still has significant lower extremity edema and would benefit from further diuresis.  2. Type 2 diabetes. Continue with sliding scale insulin and glipizide. Blood sugars are stable 3. Bilateral lower extremity cellulitis. Likely has an element of venous stasis. Currently on clindamycin. Continue to diurese and keep legs elevated. Ejection fraction is intact. He does have some diastolic dysfunction. Appears to be slowly improving with current treatment 4. Depression, started on Celexa 5. Morbid obesity 6. Fever. Patient continues to spike fevers. Etiology is not clear. Chest x-ray is unremarkable, urinalysis does not show any signs of infection. Blood cultures were sent yesterday and will be followed up. No growth to date. We will change antibiotics to clindamycin for added MRSA coverage. Venous dopplers negative for DVT.  No fever overnight  Code Status: full code Family Communication: discussed with patient, no family at bedside Disposition Plan: discharge home once improved, if continues to improve, can likely anticipate discharge home by Monday.   Consultants:  Nephrology  Wound care  Procedures:  none  Antibiotics:  Zosyn 7/9 -7/18   Vancomycin 7/9 - 7/11  Clindamycin 7/18  HPI/Subjective: Complains of dry mouth.  No new complaints  Objective: Filed Vitals:   08/03/12 0614 08/03/12 2100 08/04/12 0500 08/04/12 1433  BP: 127/81 126/83 125/82 122/76  Pulse: 67 77 72 80  Temp: 98.2 F (36.8 C) 99.7 F (37.6 C) 98.5 F (36.9 C) 98.8 F (37.1 C)  TempSrc: Oral Oral Oral   Resp: 20 20 20 20   Height:      Weight: 124.875 kg (275 lb 4.8 oz)  124.985  kg (275 lb 8.7 oz)   SpO2: 97% 98% 97% 99%    Intake/Output Summary (Last 24 hours) at 08/04/12 1642 Last data filed at 08/04/12 1525  Gross per 24 hour  Intake   6705 ml  Output   6355 ml  Net    350 ml   Filed Weights   08/01/12 0542 08/03/12 0614 08/04/12 0500  Weight: 125.51 kg (276 lb 11.2 oz) 124.875 kg (275 lb 4.8 oz) 124.985 kg (275 lb 8.7 oz)    Exam:   General:  NAD  Cardiovascular: S1, S2 RRR  Respiratory: CTA B  Abdomen: soft, nt, nd, bs+  Musculoskeletal: 1+ edema b/l. Large areas of erythema over lower extremities bilaterally. Left Lower extremity has area of crusting/scaling over anterior surface. No abscess could be palpated in either extremity. Both legs are warm to touch. No drainage noted.  Data Reviewed: Basic Metabolic Panel:  Recent Labs Lab 07/30/12 0511 07/31/12 0509 08/01/12 0625 08/02/12 0604 08/03/12 0654 08/04/12 0642  NA 137 139 138 136 135 137  K 3.4* 3.6 3.7 3.7 3.4* 3.5  CL 100 102 103 101 101 100  CO2 25 25 25 23 25 27   GLUCOSE 120* 108* 113* 106* 95 102*  BUN 40* 37* 34* 31* 31* 31*  CREATININE 3.67* 3.59* 3.39* 3.23* 2.99* 2.79*  CALCIUM 8.9 8.8 8.6 8.8 9.2 8.8  PHOS  --  4.5  --   --   --  4.0   Liver Function Tests: No results found for this basename: AST, ALT, ALKPHOS, BILITOT, PROT, ALBUMIN,  in the last 168 hours No results found for this  basename: LIPASE, AMYLASE,  in the last 168 hours No results found for this basename: AMMONIA,  in the last 168 hours CBC:  Recent Labs Lab 07/31/12 0509 08/03/12 0654 08/04/12 0642  WBC 9.2 4.0 6.6  HGB 11.9* 11.9* 11.9*  HCT 35.3* 35.1* 35.7*  MCV 91.9 90.2 91.3  PLT 256 243 244   Cardiac Enzymes: No results found for this basename: CKTOTAL, CKMB, CKMBINDEX, TROPONINI,  in the last 168 hours BNP (last 3 results)  Recent Labs  07/24/12 1429 07/27/12 0557  PROBNP 28.5 29.2   CBG:  Recent Labs Lab 08/03/12 1651 08/03/12 2014 08/04/12 0739 08/04/12 1133  08/04/12 1632  GLUCAP 72 111* 99 105* 97    Recent Results (from the past 240 hour(s))  CULTURE, BLOOD (ROUTINE X 2)     Status: None   Collection Time    08/02/12  5:28 PM      Result Value Range Status   Specimen Description BLOOD RIGHT ANTECUBITAL   Final   Special Requests BOTTLES DRAWN AEROBIC AND ANAEROBIC 6CC   Final   Culture NO GROWTH 2 DAYS   Final   Report Status PENDING   Incomplete  CULTURE, BLOOD (ROUTINE X 2)     Status: None   Collection Time    08/02/12  5:34 PM      Result Value Range Status   Specimen Description BLOOD LEFT ANTECUBITAL   Final   Special Requests BOTTLES DRAWN AEROBIC ONLY 8CC   Final   Culture NO GROWTH 2 DAYS   Final   Report Status PENDING   Incomplete     Studies: Dg Chest 2 View  08/02/2012   *RADIOLOGY REPORT*  Clinical Data: Fever.  CHEST - 2 VIEW  Comparison: July 24, 2012.  Findings: Cardiomediastinal silhouette appears normal.  No pleural effusion or pneumothorax is noted.  No acute pulmonary disease is noted.  Bony thorax is intact.  IMPRESSION: No acute cardiopulmonary abnormality seen.   Original Report Authenticated By: Lupita Raider.,  M.D.   US Venous Img Lower Bilateral  08/03/2012   *RADIOLOGY REPORT*  Clinical Data: Bilateral lower extremity edema and pain.  BILATERAL LOWER EXTREMITY VENOUS DUPLEX ULTRASOUND  Technique:  Gray-scale sonography with graded compression, as well as color Doppler and duplex ultrasound, were performed to evaluate the deep venous system of both lower extremities from the level of the common femoral vein through the popliteal and proximal calf veins.  Spectral Doppler was utilized to evaluate flow at rest and with distal augmentation maneuvers.  Comparison:  None.  Findings:  Normal compressibility of bilateral common femoral, superficial femoral, and popliteal veins is demonstrated, as well as the visualized proximal calf veins.  No filling defects to suggest DVT on grayscale or color Doppler imaging.   Doppler waveforms show normal direction of venous flow, normal respiratory phasicity and response to augmentation.  No evidence of superficial thrombophlebitis or abnormal fluid collection.  IMPRESSION: No evidence of deep vein thrombosis in either lower extremity.   Original Report Authenticated By: Irish Lack, M.D.   Dg Chest Port 1 View  08/03/2012   *RADIOLOGY REPORT*  Clinical Data: PICC line placement  PORTABLE CHEST - 1 VIEW  Comparison: 08/02/2012  Findings: The tip of the PICC line is 6.8 cm below the carina.  There is mild cardiac enlargement.  No pleural effusion or edema.  No airspace consolidation.  The visualized osseous structures are unremarkable.  IMPRESSION:  1.  Right arm PICC line tip is 6.8  cm below the carina.  Advise withdrawing by approximately 2 cm. 2.  Cardiac enlargement.   Original Report Authenticated By: Signa Kell, M.D.    Scheduled Meds: . amLODipine  10 mg Oral Daily  . citalopram  20 mg Oral Daily  . clindamycin (CLEOCIN) IV  600 mg Intravenous Q8H  . enoxaparin (LOVENOX) injection  40 mg Subcutaneous Q24H  . feeding supplement  237 mL Oral BID BM  . furosemide  60 mg Intravenous BID  . glipiZIDE  5 mg Oral QAC breakfast  . insulin aspart  0-20 Units Subcutaneous TID WC  . insulin aspart  0-5 Units Subcutaneous QHS  . insulin detemir  10 Units Subcutaneous QHS  . sodium chloride  10-40 mL Intracatheter Q12H  . sodium chloride  3 mL Intravenous Q12H   Continuous Infusions: . 0.9 % NaCl with KCl 20 mEq / L 75 mL/hr at 08/04/12 1144    Active Problems:   Cellulitis   Hypertension   Obesity   Hyperglycemia   Acute renal failure   Pedal edema   Diastolic dysfunction    Time spent:    [facility]  [facility] Pager [phone]. If 7PM-7AM, please contact night-coverage at [email or web address], password [facility] 08/04/2012, 4:42 PM  LOS: 11 days

## 2012-08-05 LAB — BASIC METABOLIC PANEL
BUN: 29 mg/dL — ABNORMAL HIGH (ref 6–23)
CO2: 26 mEq/L (ref 19–32)
GFR calc non Af Amer: 26 mL/min — ABNORMAL LOW (ref >90)
Glucose, Bld: 115 mg/dL — ABNORMAL HIGH (ref 70–99)
Potassium: 3.2 mEq/L — ABNORMAL LOW (ref 3.5–5.1)
Sodium: 134 mEq/L — ABNORMAL LOW (ref 135–145)

## 2012-08-05 LAB — GLUCOSE, CAPILLARY
Glucose-Capillary: 96 mg/dL (ref 70–99)
Glucose-Capillary: 116 mg/dL — ABNORMAL HIGH (ref 70–99)
Glucose-Capillary: 108 mg/dL — ABNORMAL HIGH (ref 70–99)

## 2012-08-05 MED ORDER — SODIUM CHLORIDE 0.45 % IV SOLN
INTRAVENOUS | Status: DC
  Administered 2012-08-05 – 2012-08-06 (×2): via INTRAVENOUS
  Filled 2012-08-05 (×2): qty 1000

## 2012-08-05 NOTE — Progress Notes (Signed)
Subjective: Interval History:Apetite is better and no nausea or vomiting Presently patient denies any difficulty in breathinng. Offers no new complaint  Objective: Vital signs in last 24 hours: Temp:  [98.8 F (37.1 C)-99.9 F (37.7 C)] 99.1 F (37.3 C) (07/20 0523) Pulse Rate:  [78-80] 79 (07/20 0523) Resp:  [20] 20 (07/20 0523) BP: (122-150)/(76-85) 128/85 mmHg (07/20 0523) SpO2:  [97 %-99 %] 99 % (07/20 0523) Weight change:   Intake/Output from previous day: 07/19 0701 - 07/20 0700 In: 2026.3 [P.O.:820; I.V.:1156.3; IV Piggyback:50] Out: 4830 [Urine:4830] Intake/Output this shift:    General appearance: alert, cooperative and no distress Resp: clear to auscultation bilaterally Cardio: regular rate and rhythm, S1, S2 normal, no murmur, click, rub or gallop GI: soft, non-tender; bowel sounds normal; no masses,  no organomegaly Extremities: edema Bilateral 2+ edema and also erythema. A part of his leg is dressed.  Lab Results:  Recent Labs  08/03/12 0654 08/04/12 0642  WBC 4.0 6.6  HGB 11.9* 11.9*  HCT 35.1* 35.7*  PLT 243 244   BMET:   Recent Labs  08/04/12 0642 08/05/12 0641  NA 137 134*  K 3.5 3.2*  CL 100 95*  CO2 27 26  GLUCOSE 102* 115*  BUN 31* 29*  CREATININE 2.79* 2.71*  CALCIUM 8.8 8.8   No results found for this basename: PTH,  in the last 72 hours Iron Studies: No results found for this basename: IRON, TIBC, TRANSFERRIN, FERRITIN,  in the last 72 hours  Studies/Results: US Venous Img Lower Bilateral  08/03/2012   *RADIOLOGY REPORT*  Clinical Data: Bilateral lower extremity edema and pain.  BILATERAL LOWER EXTREMITY VENOUS DUPLEX ULTRASOUND  Technique:  Gray-scale sonography with graded compression, as well as color Doppler and duplex ultrasound, were performed to evaluate the deep venous system of both lower extremities from the level of the common femoral vein through the popliteal and proximal calf veins.  Spectral Doppler was utilized to  evaluate flow at rest and with distal augmentation maneuvers.  Comparison:  None.  Findings:  Normal compressibility of bilateral common femoral, superficial femoral, and popliteal veins is demonstrated, as well as the visualized proximal calf veins.  No filling defects to suggest DVT on grayscale or color Doppler imaging.  Doppler waveforms show normal direction of venous flow, normal respiratory phasicity and response to augmentation.  No evidence of superficial thrombophlebitis or abnormal fluid collection.  IMPRESSION: No evidence of deep vein thrombosis in either lower extremity.   Original Report Authenticated By: Irish Lack, M.D.   Dg Chest Port 1 View  08/03/2012   *RADIOLOGY REPORT*  Clinical Data: PICC line placement  PORTABLE CHEST - 1 VIEW  Comparison: 08/02/2012  Findings: The tip of the PICC line is 6.8 cm below the carina.  There is mild cardiac enlargement.  No pleural effusion or edema.  No airspace consolidation.  The visualized osseous structures are unremarkable.  IMPRESSION:  1.  Right arm PICC line tip is 6.8 cm below the carina.  Advise withdrawing by approximately 2 cm. 2.  Cardiac enlargement.   Original Report Authenticated By: Signa Kell, M.D.    I have reviewed the patient'Freeman current medications.  Assessment/Plan: Problem #1 acute kidney injury his BUN is 29 and creatinine is 2.71 renal function is  improving. Presently is none oliguric. Problem #2 hypokalemia: His  potassium is 3.2  On potassium supplement and declning possibly from lasix  Problem #3 hyponatremia sodium 134 has corrected. Problem #4 hypertension his blood pressure seems to  be reasonably controlled Problem #5 diabetes Problem #6 cellulitis of his leg bilateral patient on antibiotics. Patient with low grad fever but his white blood cell count is normal.. Problem #7 metabolic bone disease calcium was in acceptable range. His phosphorus is with in range Plan: Change her IV fluid to to normal saline with  40 mEq of KCl at 50 cc per hour DC Lasix We'll check his basic metabolic panel in the morning.   LOS: 12 days   Juan Freeman 08/05/2012,9:14 AM

## 2012-08-05 NOTE — Progress Notes (Signed)
[facility] PROGRESS NOTE  Juan Freeman:[id number] DOB: 01/25/1965 DOA: 07/24/2012 PCP: No PCP Per Patient  Assessment/Plan: 1. Acute renal failure. Creatinine appears to be plateauing. Appreciate nephrology assistance. Continue current treatments with IV fluids and Lasix. Renal function is very slow to improve. He still has significant lower extremity edema and would benefit from further diuresis.  2. Type 2 diabetes. Continue with sliding scale insulin and glipizide. Blood sugars are stable 3. Bilateral lower extremity cellulitis. Likely has an element of venous stasis. Currently on clindamycin. Continue to diurese and keep legs elevated. Ejection fraction is intact. He does have some diastolic dysfunction. Appears to be slowly improving with current treatment 4. Depression, started on Celexa 5. Morbid obesity 6. Fever. This appears to have improved. Chest x-ray is unremarkable, urinalysis does not show any signs of infection. Blood cultures were sent yesterday and will be followed up. No growth to date. We will change antibiotics to clindamycin for added MRSA coverage. Venous dopplers negative for DVT.  No fever overnight  Code Status: full code Family Communication: discussed with patient, no family at bedside Disposition Plan: discharge home once improved, if continues to improve, can likely anticipate discharge home by Monday.   Consultants:  Nephrology  Wound care  Procedures:  none  Antibiotics:  Zosyn 7/9 -7/18   Vancomycin 7/9 - 7/11  Clindamycin 7/18  HPI/Subjective: No complaints today. Feels legs are getting better.  Objective: Filed Vitals:   08/04/12 0500 08/04/12 1433 08/04/12 2144 08/05/12 0523  BP: 125/82 122/76 150/82 128/85  Pulse: 72 80 78 79  Temp: 98.5 F (36.9 C) 98.8 F (37.1 C) 99.9 F (37.7 C) 99.1 F (37.3 C)  TempSrc: Oral     Resp: 20 20 20 20   Height:      Weight: 124.985 kg (275 lb 8.7 oz)     SpO2: 97% 99% 97% 99%     Intake/Output Summary (Last 24 hours) at 08/05/12 1900 Last data filed at 08/05/12 1716  Gross per 24 hour  Intake 1808.75 ml  Output   2150 ml  Net -341.25 ml   Filed Weights   08/01/12 0542 08/03/12 0614 08/04/12 0500  Weight: 125.51 kg (276 lb 11.2 oz) 124.875 kg (275 lb 4.8 oz) 124.985 kg (275 lb 8.7 oz)    Exam:   General:  NAD  Cardiovascular: S1, S2 RRR  Respiratory: CTA B  Abdomen: soft, nt, nd, bs+  Musculoskeletal: 1+ edema b/l. Large areas of erythema over lower extremities bilaterally. Left Lower extremity has area of crusting/scaling over anterior surface. No abscess could be palpated in either extremity. Both legs are warm to touch. No drainage noted.  Data Reviewed: Basic Metabolic Panel:  Recent Labs Lab 07/30/12 0511 07/31/12 0509 08/01/12 8119 08/02/12 0604 08/03/12 0654 08/04/12 0642 08/05/12 0641  NA 137 139 138 136 135 137 134*  K 3.4* 3.6 3.7 3.7 3.4* 3.5 3.2*  CL 100 102 103 101 101 100 95*  CO2 25 25 25 23 25 27 26   GLUCOSE 120* 108* 113* 106* 95 102* 115*  BUN 40* 37* 34* 31* 31* 31* 29*  CREATININE 3.67* 3.59* 3.39* 3.23* 2.99* 2.79* 2.71*  CALCIUM 8.9 8.8 8.6 8.8 9.2 8.8 8.8  PHOS  --  4.5  --   --   --  4.0  --    Liver Function Tests: No results found for this basename: AST, ALT, ALKPHOS, BILITOT, PROT, ALBUMIN,  in the last 168 hours No results found for this basename:  LIPASE, AMYLASE,  in the last 168 hours No results found for this basename: AMMONIA,  in the last 168 hours CBC:  Recent Labs Lab 07/31/12 0509 08/03/12 0654 08/04/12 0642  WBC 9.2 4.0 6.6  HGB 11.9* 11.9* 11.9*  HCT 35.3* 35.1* 35.7*  MCV 91.9 90.2 91.3  PLT 256 243 244   Cardiac Enzymes: No results found for this basename: CKTOTAL, CKMB, CKMBINDEX, TROPONINI,  in the last 168 hours BNP (last 3 results)  Recent Labs  07/24/12 1429 07/27/12 0557  PROBNP 28.5 29.2   CBG:  Recent Labs Lab 08/04/12 1632 08/04/12 2021 08/05/12 0815  08/05/12 1158 08/05/12 1649  GLUCAP 97 108* 96 116* 86    Recent Results (from the past 240 hour(s))  CULTURE, BLOOD (ROUTINE X 2)     Status: None   Collection Time    08/02/12  5:28 PM      Result Value Range Status   Specimen Description BLOOD RIGHT ANTECUBITAL   Final   Special Requests BOTTLES DRAWN AEROBIC AND ANAEROBIC 6CC   Final   Culture NO GROWTH 3 DAYS   Final   Report Status PENDING   Incomplete  CULTURE, BLOOD (ROUTINE X 2)     Status: None   Collection Time    08/02/12  5:34 PM      Result Value Range Status   Specimen Description BLOOD LEFT ANTECUBITAL   Final   Special Requests BOTTLES DRAWN AEROBIC ONLY 8CC   Final   Culture NO GROWTH 3 DAYS   Final   Report Status PENDING   Incomplete     Studies: No results found.  Scheduled Meds: . amLODipine  10 mg Oral Daily  . citalopram  20 mg Oral Daily  . clindamycin (CLEOCIN) IV  600 mg Intravenous Q8H  . enoxaparin (LOVENOX) injection  40 mg Subcutaneous Q24H  . feeding supplement  237 mL Oral BID BM  . glipiZIDE  5 mg Oral QAC breakfast  . insulin aspart  0-20 Units Subcutaneous TID WC  . insulin aspart  0-5 Units Subcutaneous QHS  . insulin detemir  10 Units Subcutaneous QHS  . sodium chloride  10-40 mL Intracatheter Q12H  . sodium chloride  3 mL Intravenous Q12H   Continuous Infusions: . sodium chloride 0.45 % with kcl 50 mL/hr at 08/05/12 1104    Active Problems:   Cellulitis   Hypertension   Obesity   Hyperglycemia   Acute renal failure   Pedal edema   Diastolic dysfunction    Time spent:    [facility]  [facility] Pager [phone]. If 7PM-7AM, please contact night-coverage at [email or web address], password [facility] 08/05/2012, 7:00 PM  LOS: 12 days

## 2012-08-06 LAB — GLUCOSE, CAPILLARY
Glucose-Capillary: 76 mg/dL (ref 70–99)
Glucose-Capillary: 95 mg/dL (ref 70–99)

## 2012-08-06 LAB — BASIC METABOLIC PANEL
CO2: 25 mEq/L (ref 19–32)
Calcium: 8.9 mg/dL (ref 8.4–10.5)
Chloride: 104 mEq/L (ref 96–112)
Creatinine, Ser: 2.57 mg/dL — ABNORMAL HIGH (ref 0.50–1.35)
Glucose, Bld: 117 mg/dL — ABNORMAL HIGH (ref 70–99)
Sodium: 137 mEq/L (ref 135–145)

## 2012-08-06 MED ORDER — CITALOPRAM HYDROBROMIDE 20 MG PO TABS: 20.0000 mg | ORAL_TABLET | Freq: Every day | ORAL | Status: AC

## 2012-08-06 MED ORDER — GLIPIZIDE 5 MG PO TABS: 5.0000 mg | ORAL_TABLET | Freq: Every day | ORAL | Status: AC

## 2012-08-06 MED ORDER — CLINDAMYCIN HCL 300 MG PO CAPS: 300.0000 mg | ORAL_CAPSULE | Freq: Four times a day (QID) | ORAL | Status: AC

## 2012-08-06 MED ORDER — AMLODIPINE BESYLATE 10 MG PO TABS: 10.0000 mg | ORAL_TABLET | Freq: Every day | ORAL | Status: AC

## 2012-08-06 MED ORDER — HYDROGEN PEROXIDE 3 % EX SOLN
CUTANEOUS | Status: AC
  Filled 2012-08-06: qty 480

## 2012-08-06 NOTE — Progress Notes (Signed)
Discharge instructions given on medications and follow up visits patient verbalized understanding. Handouts given on cellulitis. Prescriptions sent with patient. No c/o pain or discomfort noted. Dressings were changed this afternoon to bilateral lower extremities. Accompanied by staff to an awaiting vehicle.

## 2012-08-06 NOTE — Progress Notes (Signed)
Subjective: Interval History:Apetite is better and no nausea or vomiting Presently patient denies any difficulty in breathinng.Patient had an episoide of dizzines  Objective: Vital signs in last 24 hours: Temp:  [98.3 F (36.8 C)] 98.3 F (36.8 C) (07/21 0530) Pulse Rate:  [73] 73 (07/21 0530) Resp:  [18-20] 18 (07/21 0530) BP: (132-140)/(69-72) 132/69 mmHg (07/21 0530) SpO2:  [97 %-100 %] 97 % (07/21 0530) Weight:  [123.106 kg (271 lb 6.4 oz)] 123.106 kg (271 lb 6.4 oz) (07/21 0530) Weight change:   Intake/Output from previous day: 07/20 0701 - 07/21 0700 In: 2048.8 [P.O.:240; I.V.:1658.8; IV Piggyback:150] Out: 3050 [Urine:3050] Intake/Output this shift:    General appearance: alert, cooperative and no distress Resp: clear to auscultation bilaterally Cardio: regular rate and rhythm, S1, S2 normal, no murmur, click, rub or gallop GI: soft, non-tender; bowel sounds normal; no masses,  no organomegaly Extremities: edema Bilateral 2+ edema and also erythema. A part of his leg is dressed.  Lab Results:  Recent Labs  08/04/12 0642  WBC 6.6  HGB 11.9*  HCT 35.7*  PLT 244   BMET:   Recent Labs  08/05/12 0641 08/06/12 0500  NA 134* 137  K 3.2* 3.6  CL 95* 104  CO2 26 25  GLUCOSE 115* 117*  BUN 29* 31*  CREATININE 2.71* 2.57*  CALCIUM 8.8 8.9   No results found for this basename: PTH,  in the last 72 hours Iron Studies: No results found for this basename: IRON, TIBC, TRANSFERRIN, FERRITIN,  in the last 72 hours  Studies/Results: No results found.  I have reviewed the patient's current medications.  Assessment/Plan: Problem #1 acute kidney injury his BUN is 31 and creatinine is 2.57 renal function is  improving. Presently is none oliguric. Problem #2 hypokalemia: His  potassium is 3.6  On potassium supplement and has corrected Problem #3 hyponatremia sodium 137 has corrected. Problem #4 hypertension his blood pressure seems to be reasonably controlled Problem  #5 diabetes Problem #6 cellulitis of his leg bilateral patient on antibiotics. Patient with low grad fever but his white blood cell count is normal.. Problem #7 metabolic bone disease calcium was in acceptable range. His phosphorus is with in range Plan: D/C iv fluid We'll check his basic metabolic panel in the morning.   LOS: 13 days   Audrinna Sherman S 08/06/2012,7:47 AM

## 2012-08-06 NOTE — Discharge Summary (Signed)
Physician Discharge Summary  Juan Freeman:[id number] DOB: 1965/06/01 DOA: 07/24/2012  PCP: No PCP Per Patient  Admit date: 07/24/2012 Discharge date: 08/06/2012  Time spent:  45 minutes  Recommendations for Outpatient Follow-up:  1. Follow up with the primary care physician has been scheduled 2. Follow up with nephrology as an outpatient.  Discharge Diagnoses:  Active Problems:   Cellulitis   Hypertension   Obesity   Hyperglycemia   Acute renal failure   Pedal edema   Diastolic dysfunction  type 2 diabetes  Discharge Condition: Improved  Diet recommendation: Low salt, low carb  Filed Weights   08/03/12 0614 08/04/12 0500 08/06/12 0530  Weight: 124.875 kg (275 lb 4.8 oz) 124.985 kg (275 lb 8.7 oz) 123.106 kg (271 lb 6.4 oz)    History of present illness:  Juan Freeman is a 47 y.o. male without known medical history mainly because he is not regularly seen by a doctor comes in with worsening bilateral LE left worse than right swelling, redness and weeping/drainage mainly from left lower extremity. 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 Denies systemic symptoms such as fever/chills/lightheadedness. Denies chest pain/breathing difficulties. Denies nausea/vomiting diarrhea. He thinks he has heart failure because of the leg swelling but denies ever having had a 2D echo. He reads a lot on the Internet about various medical conditions and is generally medically aware. Endorses intentional weight loss by giving up sodas.    Hospital Course:  This patient was admitted to the hospital with bilateral lower extremity cellulitis. He was placed empirically on vancomycin and Zosyn. Unfortunately, he developed acute renal failure. Etiologies include vancomycin-induced renal failure. He was seen by nephrology. History supportively with IV fluids and Lasix. Fortunately his creatinine has started to trend down of her recovery has  been slow. Currently creatinine is at 2.5. He'll followup with nephrology as an outpatient. Regarding his cellulitis,  Vancomycin was discontinued when he developed renal failure. He was continued on Zosyn and had mild improvement. He subsequently began having fevers again. Infectious workup was negative for any UTI or pneumonia. He was not having any diarrhea. Antibiotics were changed from Zosyn to clindamycin for better MRSA coverage. Fevers have since resolved and his cellulitis continues to improve. The remainder of his medical issues have been stable. His pulse is a discharge home followup with primary care physician/nephrologist as an outpatient.   Procedures:  none  Consultations:  Nephrology  Wound Care  Discharge Exam: Filed Vitals:   08/05/12 0523 08/05/12 2145 08/06/12 0530 08/06/12 1536  BP: 128/85 140/72 132/69 128/84  Pulse: 79 73 73 73  Temp: 99.1 F (37.3 C) 98.3 F (36.8 C) 98.3 F (36.8 C) 97.9 F (36.6 C)  TempSrc:  Oral Oral Oral  Resp: 20 20 18 18   Height:      Weight:   123.106 kg (271 lb 6.4 oz)   SpO2: 99% 100% 97% 97%    General: No acute distress Cardiovascular: S1, S2, regular rate and rhythm Respiratory: Clear to auscultation bilaterally Extremities: Erythema still present in the lower extremities, but improved. Lower extremity wounds are scabbing over, no discharge noted. Edema has significantly improved.  Discharge Instructions  Discharge Orders   Future Orders Complete By Expires     Call MD for:  redness, tenderness, or signs of infection (pain, swelling, redness, odor or green/yellow discharge around incision site)  As directed  Call MD for:  severe uncontrolled pain  As directed     Call MD for:  temperature >100.4  As directed     Diet - low sodium heart healthy  As directed     Increase activity slowly  As directed         Medication List    STOP taking these medications       ibuprofen 200 MG tablet  Commonly known as:   ADVIL,MOTRIN      TAKE these medications       amLODipine 10 MG tablet  Commonly known as:  NORVASC  Take 1 tablet (10 mg total) by mouth daily.     citalopram 20 MG tablet  Commonly known as:  CELEXA  Take 1 tablet (20 mg total) by mouth daily.     clindamycin 300 MG capsule  Commonly known as:  CLEOCIN  Take 1 capsule (300 mg total) by mouth 4 (four) times daily.     glipiZIDE 5 MG tablet  Commonly known as:  GLUCOTROL  Take 1 tablet (5 mg total) by mouth daily before breakfast.       No Known Allergies     Follow-up Information   Follow up with [facility], MD On 08/08/2012. (11:00 am/eligibility appointment, bring information given to you at dc.)    Contact information:   [address] [place] [address] [phone]       Follow up with [facility] On 08/13/2012. (hospital f/u appt. @ 2pm)    Contact information:   [facility]  [address]  [phone]       The results of significant diagnostics from this hospitalization (including imaging, microbiology, ancillary and laboratory) are listed below for reference.    Significant Diagnostic Studies: Dg Chest 2 View  08/02/2012   *RADIOLOGY REPORT*  Clinical Data: Fever.  CHEST - 2 VIEW  Comparison: July 24, 2012.  Findings: Cardiomediastinal silhouette appears normal.  No pleural effusion or pneumothorax is noted.  No acute pulmonary disease is noted.  Bony thorax is intact.  IMPRESSION: No acute cardiopulmonary abnormality seen.   Original Report Authenticated By: Lupita Raider.,  M.D.   US Renal  07/29/2012   *RADIOLOGY REPORT*  Clinical Data: Renal insufficiency.  Check renal size  RENAL/URINARY TRACT ULTRASOUND COMPLETE  Comparison:  None  Findings:  Right Kidney:  Severely limited evaluation of the right kidney due to large body habitus.  The right kidney is very poorly visualized. Estimated renal length is 11.0 cm.  No obvious obstruction or mass.  Left Kidney:  Left  kidney is better seen than the right but still there is limited evaluation of left kidney.  Left kidney measures 11.4 cm.  No obstruction or mass.  Bladder:  Empty bladder with Foley catheter.  IMPRESSION: Ultrasound is markedly limited in this patient due to large patient size is well as hepatomegaly and fatty infiltration of liver.  Renal size appears normal and there is no hydronephrosis.   Original Report Authenticated By: Janeece Riggers, M.D.   US Venous Img Lower Bilateral  08/03/2012   *RADIOLOGY REPORT*  Clinical Data: Bilateral lower extremity edema and pain.  BILATERAL LOWER EXTREMITY VENOUS DUPLEX ULTRASOUND  Technique:  Gray-scale sonography with graded compression, as well as color Doppler and duplex ultrasound, were performed to evaluate the deep venous system of both lower extremities from the level of the common femoral vein through the popliteal and proximal calf veins.  Spectral  Doppler was utilized to evaluate flow at rest and with distal augmentation maneuvers.  Comparison:  None.  Findings:  Normal compressibility of bilateral common femoral, superficial femoral, and popliteal veins is demonstrated, as well as the visualized proximal calf veins.  No filling defects to suggest DVT on grayscale or color Doppler imaging.  Doppler waveforms show normal direction of venous flow, normal respiratory phasicity and response to augmentation.  No evidence of superficial thrombophlebitis or abnormal fluid collection.  IMPRESSION: No evidence of deep vein thrombosis in either lower extremity.   Original Report Authenticated By: Irish Lack, M.D.   Dg Chest Port 1 View  08/03/2012   *RADIOLOGY REPORT*  Clinical Data: PICC line placement  PORTABLE CHEST - 1 VIEW  Comparison: 08/02/2012  Findings: The tip of the PICC line is 6.8 cm below the carina.  There is mild cardiac enlargement.  No pleural effusion or edema.  No airspace consolidation.  The visualized osseous structures are unremarkable.   IMPRESSION:  1.  Right arm PICC line tip is 6.8 cm below the carina.  Advise withdrawing by approximately 2 cm. 2.  Cardiac enlargement.   Original Report Authenticated By: Signa Kell, M.D.   Dg Chest Portable 1 View  07/24/2012   *RADIOLOGY REPORT*  Clinical Data: Leg swelling and cellulitis  PORTABLE CHEST - 1 VIEW  Comparison: 04/08/2011  Findings: Heart size is normal.  There is no pleural effusion identified.  No airspace consolidation.  Atelectasis is noted in the left base.  IMPRESSION:  1.  Left base atelectasis.   Original Report Authenticated By: Signa Kell, M.D.    Microbiology: Recent Results (from the past 240 hour(s))  CULTURE, BLOOD (ROUTINE X 2)     Status: None   Collection Time    08/02/12  5:28 PM      Result Value Range Status   Specimen Description BLOOD RIGHT ANTECUBITAL   Final   Special Requests BOTTLES DRAWN AEROBIC AND ANAEROBIC 6CC   Final   Culture NO GROWTH 4 DAYS   Final   Report Status PENDING   Incomplete  CULTURE, BLOOD (ROUTINE X 2)     Status: None   Collection Time    08/02/12  5:34 PM      Result Value Range Status   Specimen Description BLOOD LEFT ANTECUBITAL   Final   Special Requests BOTTLES DRAWN AEROBIC ONLY 8CC   Final   Culture NO GROWTH 4 DAYS   Final   Report Status PENDING   Incomplete     Labs: Basic Metabolic Panel:  Recent Labs Lab 07/31/12 0509  08/02/12 0604 08/03/12 0654 08/04/12 0642 08/05/12 0641 08/06/12 0500  NA 139  < > 136 135 137 134* 137  K 3.6  < > 3.7 3.4* 3.5 3.2* 3.6  CL 102  < > 101 101 100 95* 104  CO2 25  < > 23 25 27 26 25   GLUCOSE 108*  < > 106* 95 102* 115* 117*  BUN 37*  < > 31* 31* 31* 29* 31*  CREATININE 3.59*  < > 3.23* 2.99* 2.79* 2.71* 2.57*  CALCIUM 8.8  < > 8.8 9.2 8.8 8.8 8.9  PHOS 4.5  --   --   --  4.0  --   --   < > = values in this interval not displayed. Liver Function Tests: No results found for this basename: AST, ALT, ALKPHOS, BILITOT, PROT, ALBUMIN,  in the last 168 hours No  results found for this basename:  LIPASE, AMYLASE,  in the last 168 hours No results found for this basename: AMMONIA,  in the last 168 hours CBC:  Recent Labs Lab 07/31/12 0509 08/03/12 0654 08/04/12 0642  WBC 9.2 4.0 6.6  HGB 11.9* 11.9* 11.9*  HCT 35.3* 35.1* 35.7*  MCV 91.9 90.2 91.3  PLT 256 243 244   Cardiac Enzymes: No results found for this basename: CKTOTAL, CKMB, CKMBINDEX, TROPONINI,  in the last 168 hours BNP: BNP (last 3 results)  Recent Labs  07/24/12 1429 07/27/12 0557  PROBNP 28.5 29.2   CBG:  Recent Labs Lab 08/05/12 1158 08/05/12 1649 08/05/12 2034 08/06/12 0908 08/06/12 1153  GLUCAP 116* 86 108* 163* 76       Signed:  Gissell Barra  [facility] 08/06/2012, 3:49 PM

## 2012-08-07 LAB — CULTURE, BLOOD (ROUTINE X 2)

## 2014-12-04 IMAGING — US US RENAL
1 series · 14 of 24 positions shown · non-contrast
Comparison: None

CLINICAL DATA: Renal insufficiency.  Check renal size

RENAL/URINARY TRACT ULTRASOUND COMPLETE

[Series 1: us renal · 0.34mm/px · 14 of 24 slices shown]
[im 1/24]
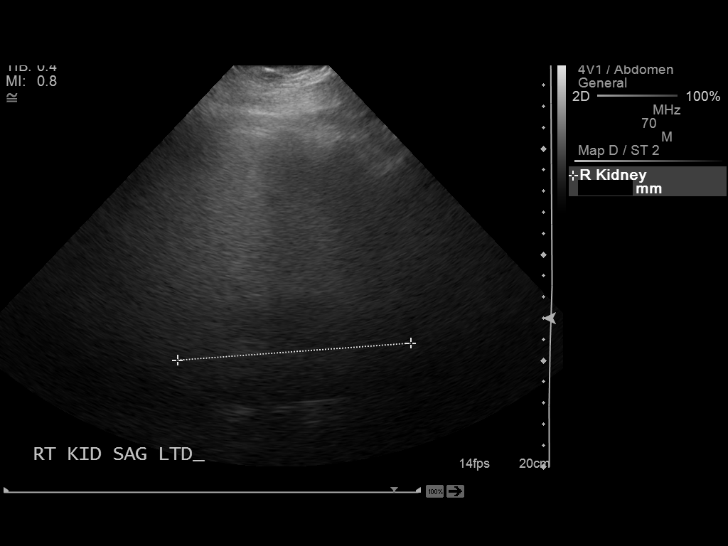
[im 3/24]
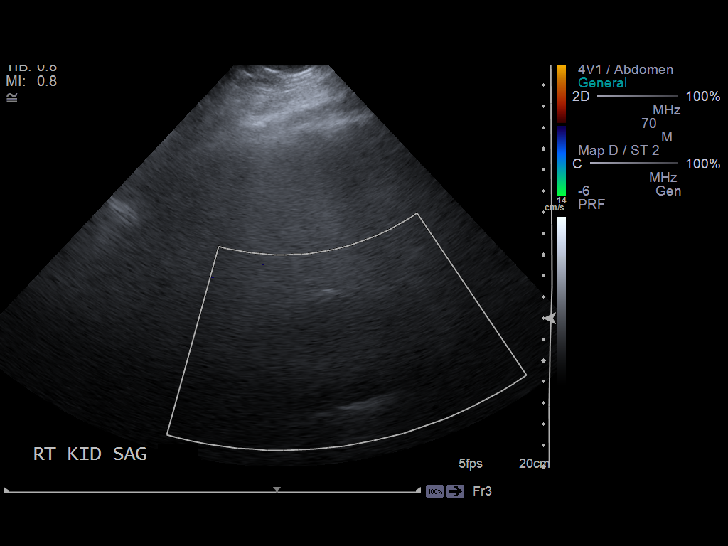
[im 5/24]
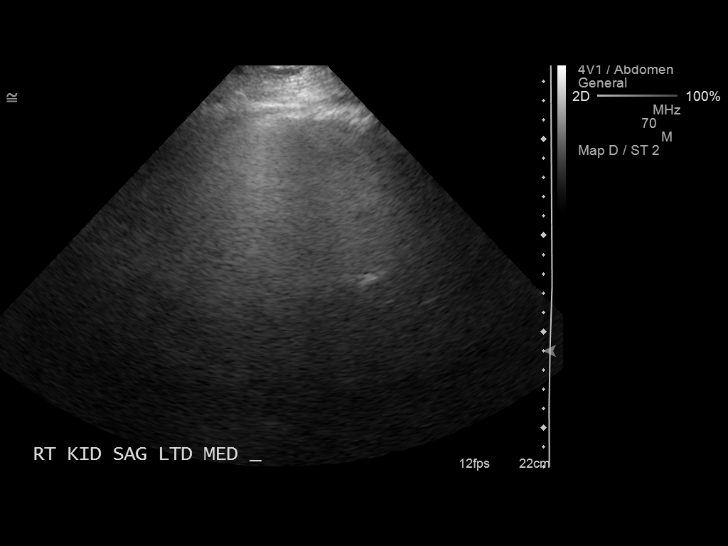
[im 7/24]
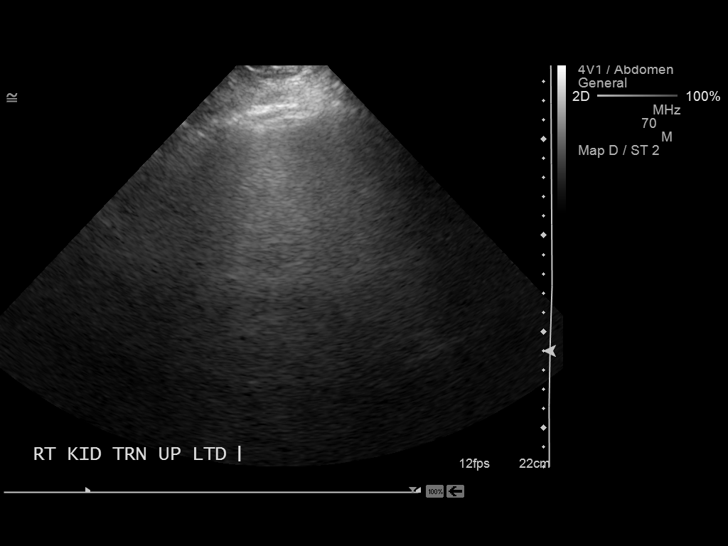
[im 8/24]
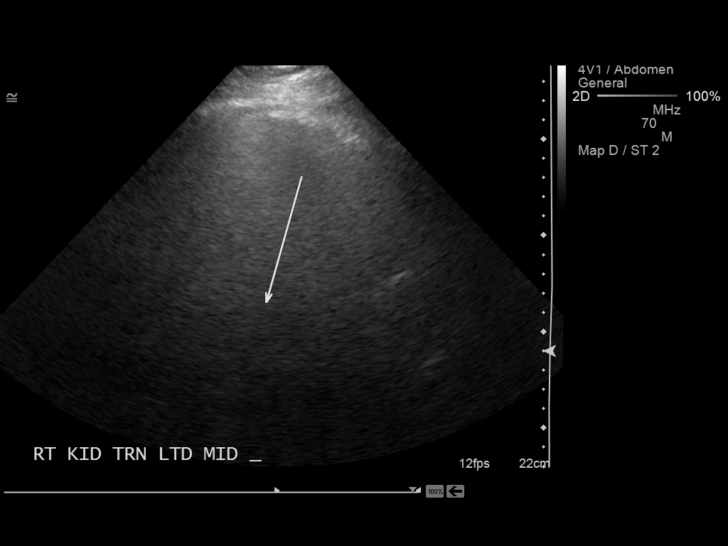
[im 10/24]
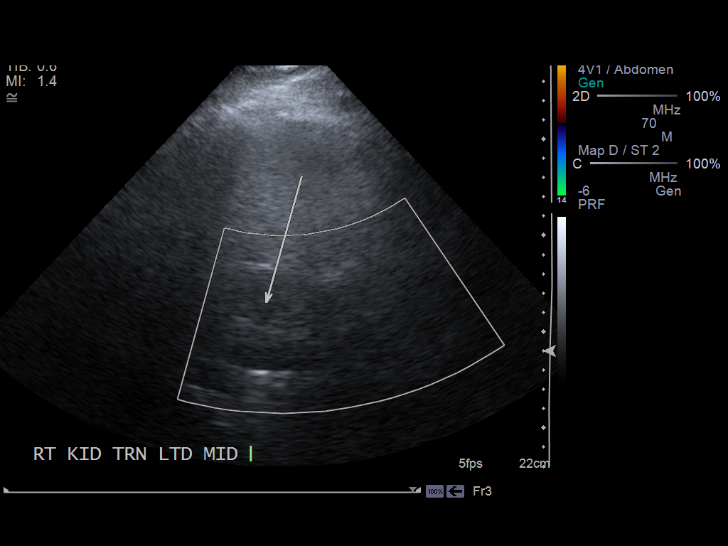
[im 12/24]
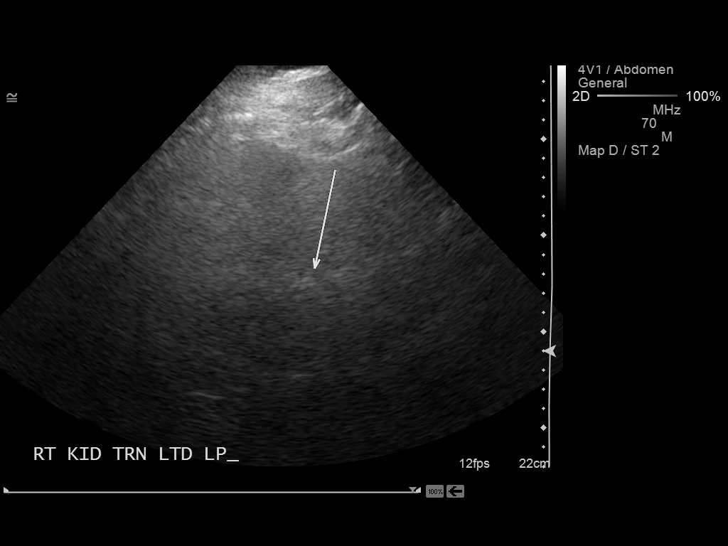
[im 13/24]
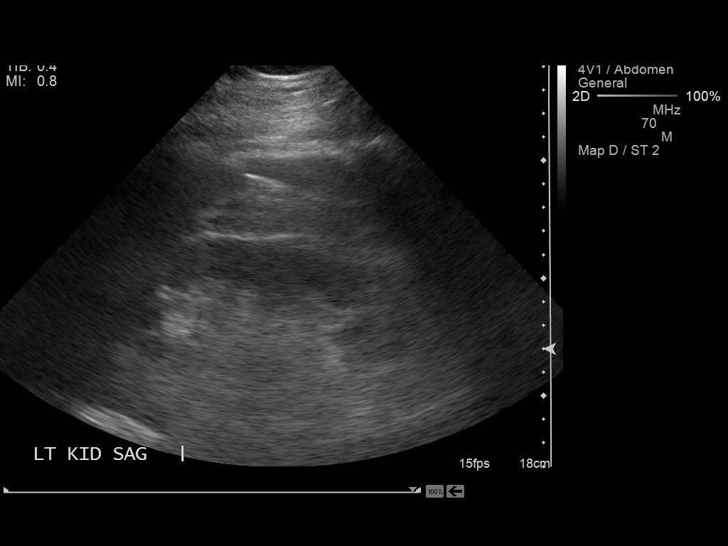
[im 15/24]
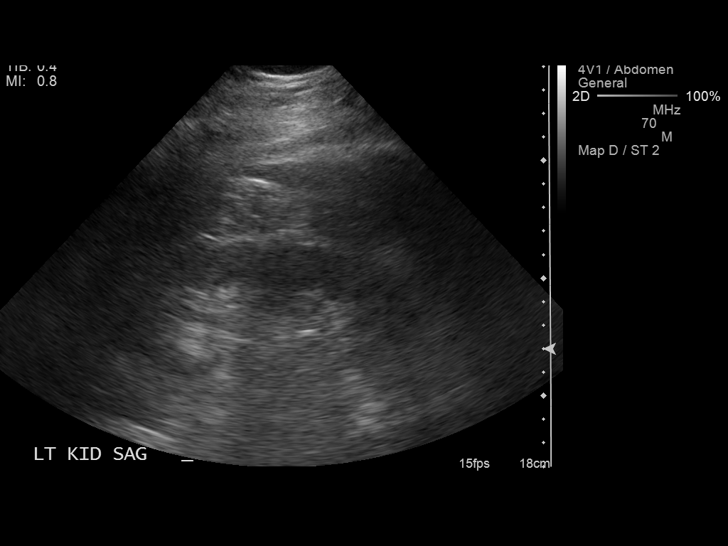
[im 17/24]
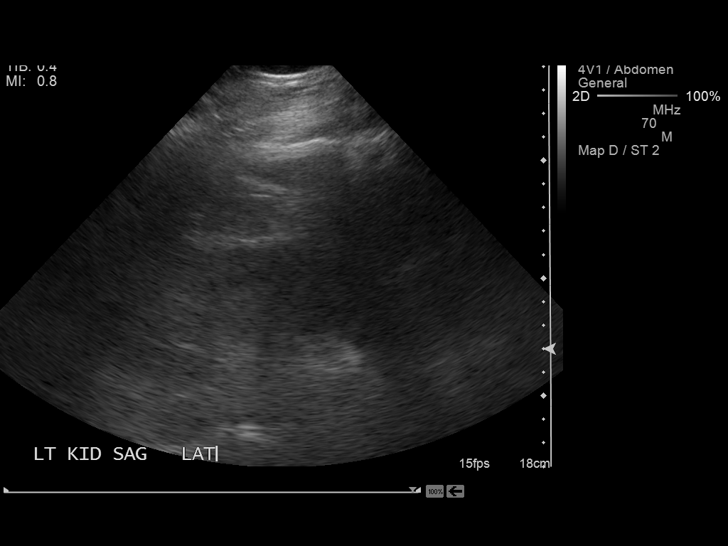
[im 19/24]
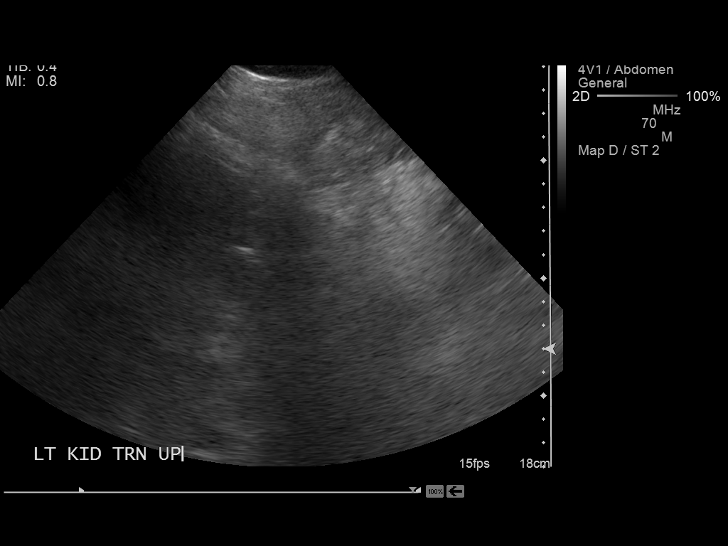
[im 20/24]
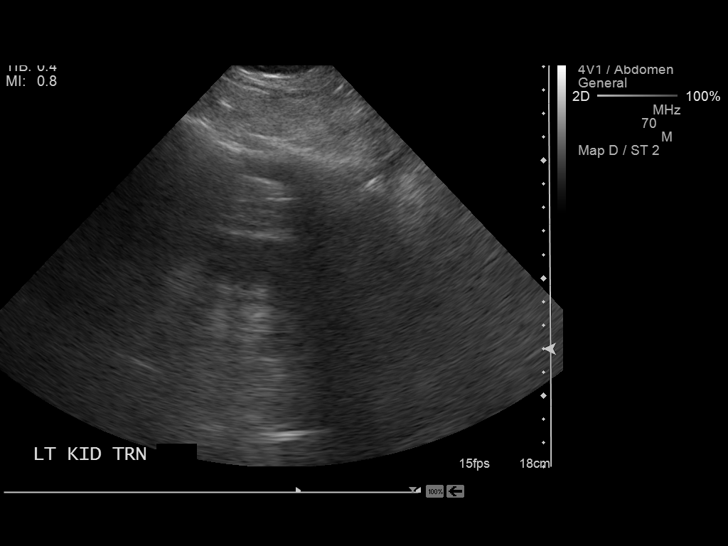
[im 22/24]
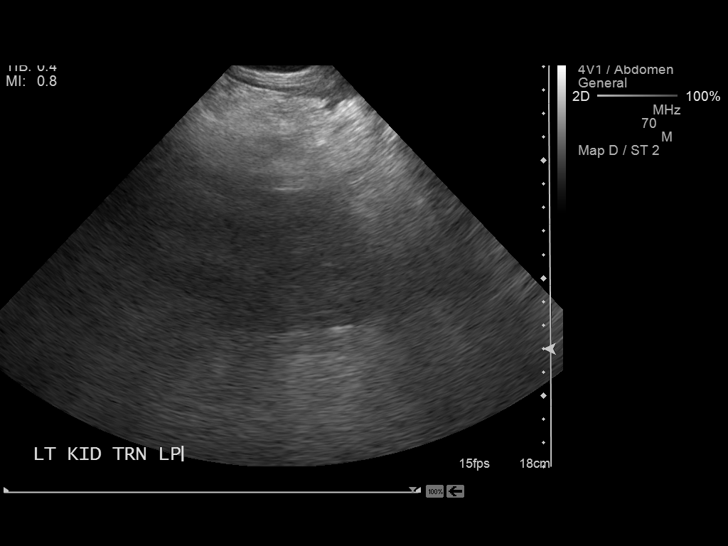
[im 24/24]
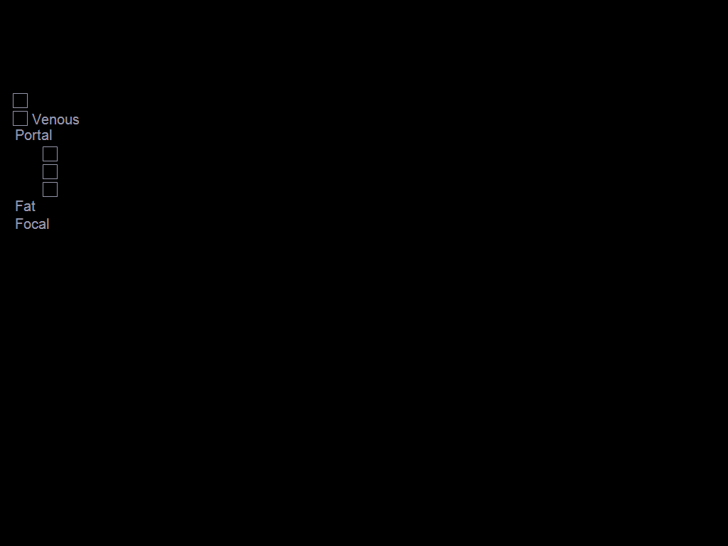

[14 of 24 positions shown; findings below may reference images not displayed]

FINDINGS: Right Kidney:  Severely limited evaluation of the right kidney due
to large body habitus.  The right kidney is very poorly visualized.
Estimated renal length is 11.0 cm.  No obvious obstruction or mass.

Left Kidney:  Left kidney is better seen than the right but still
there is limited evaluation of left kidney.  Left kidney measures
11.4 cm.  No obstruction or mass.

Bladder:  Empty bladder with Foley catheter.
IMPRESSION: Ultrasound is markedly limited in this patient due to large patient
size is well as hepatomegaly and fatty infiltration of liver.

Renal size appears normal and there is no hydronephrosis.

## 2014-12-09 IMAGING — CR DG CHEST 1V PORT
2 series · 2 of 2 positions shown · non-contrast
Comparison: 08/02/2012

CLINICAL DATA: PICC line placement

PORTABLE CHEST - 1 VIEW

[portable (1 of 2)]
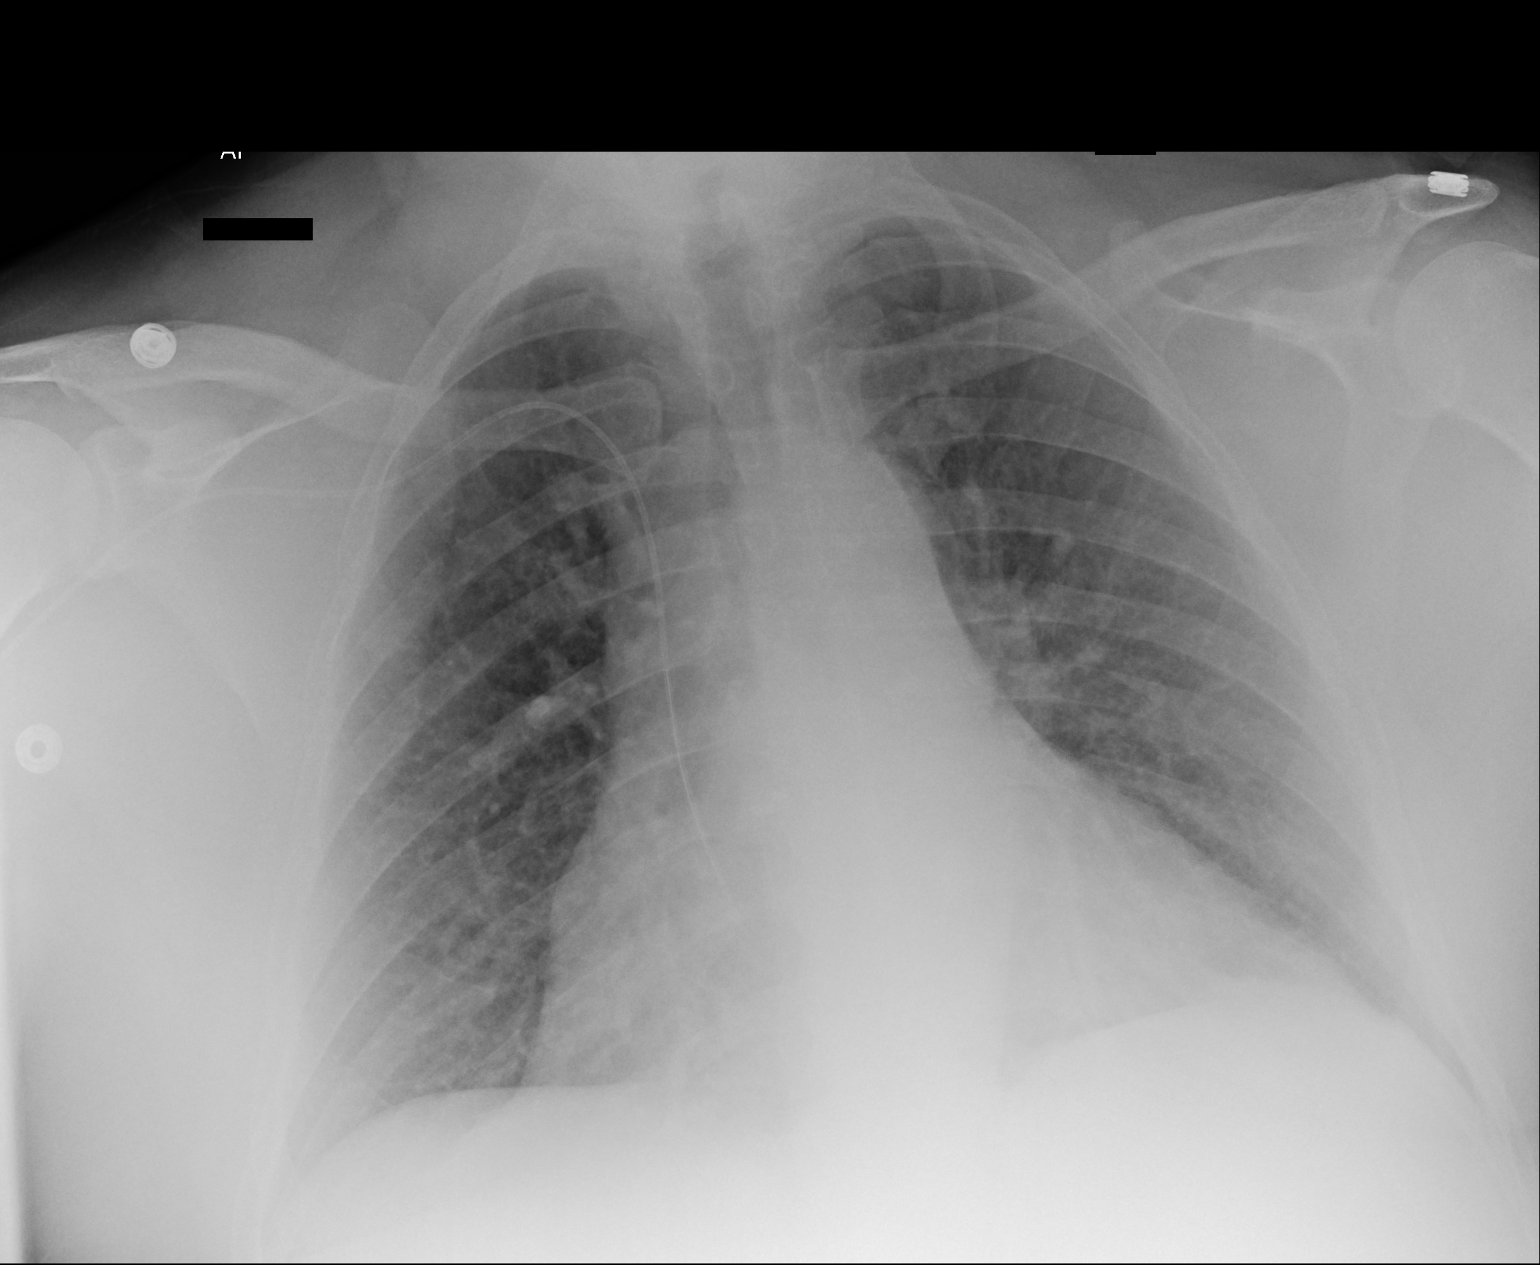

[portable (2 of 2)]
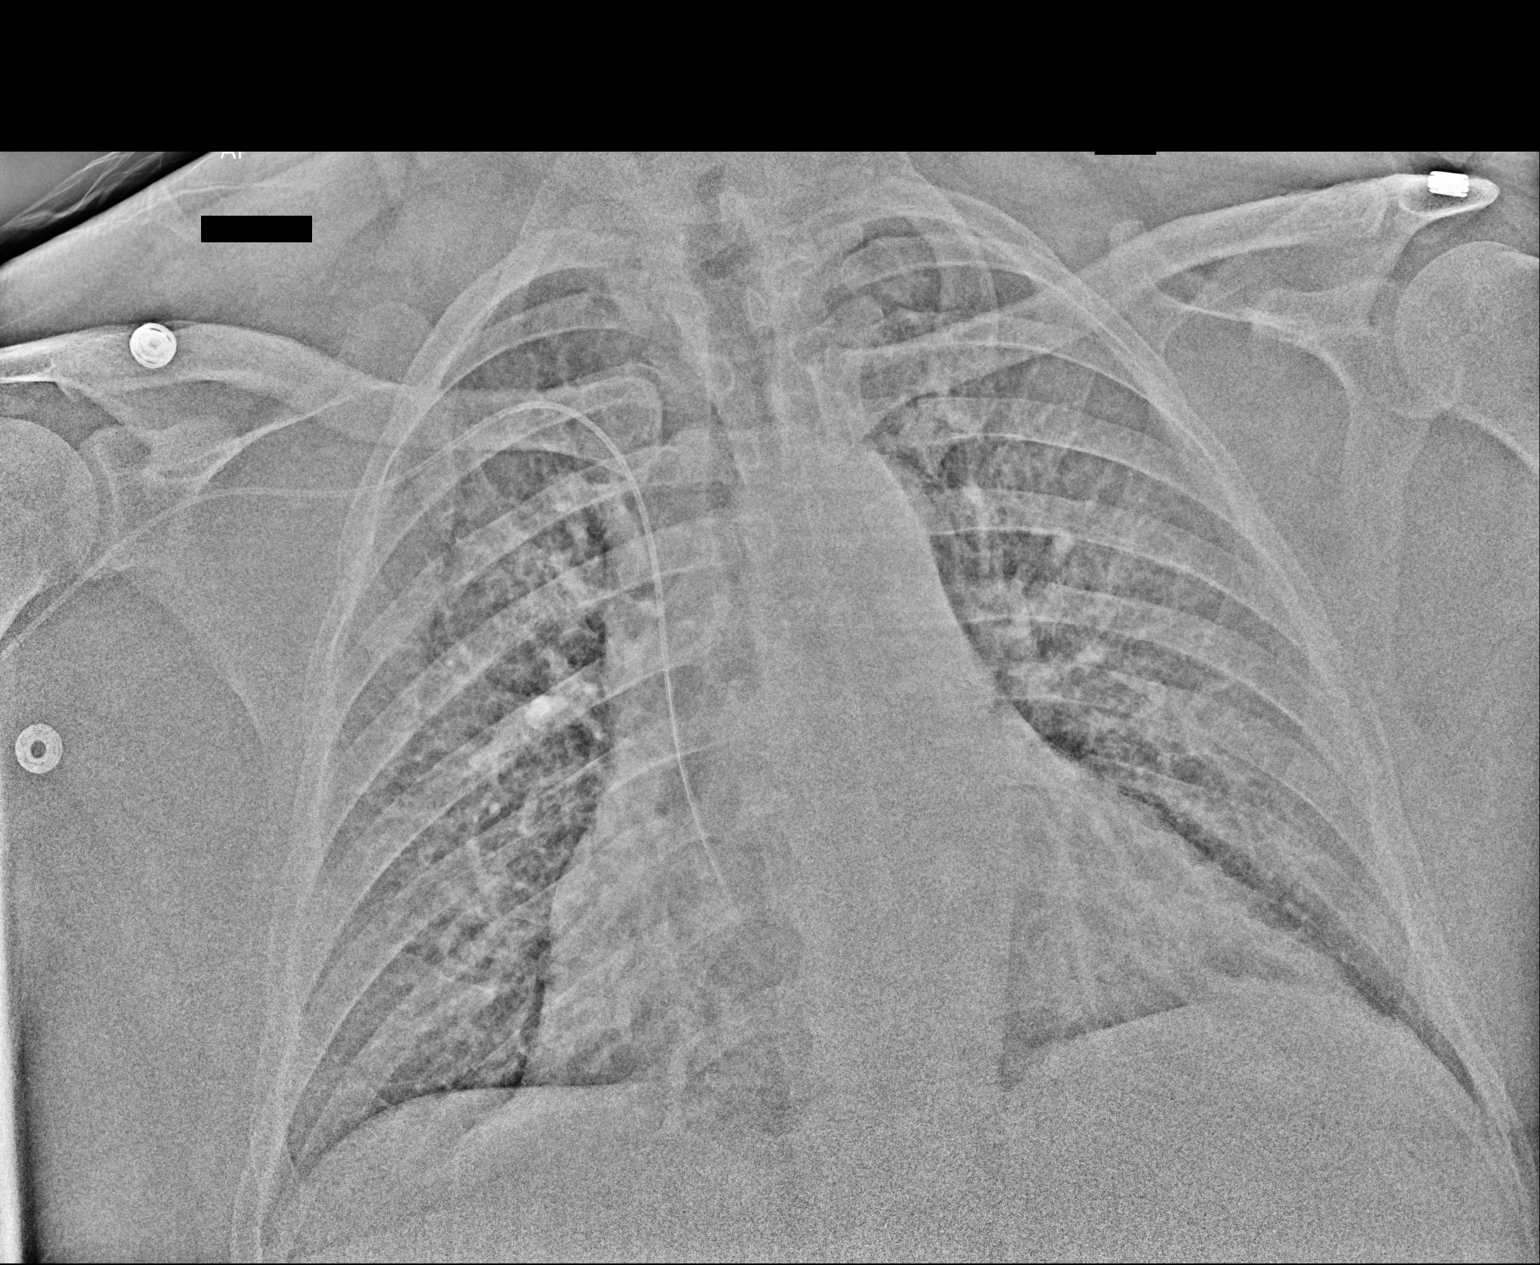

[2 of 2 positions shown; findings below may reference images not displayed]

FINDINGS: The tip of the PICC line is 6.8 cm below the carina.

There is mild cardiac enlargement.

No pleural effusion or edema.

No airspace consolidation.

The visualized osseous structures are unremarkable.
IMPRESSION: 1.  Right arm PICC line tip is 6.8 cm below the carina.  Advise
withdrawing by approximately 2 cm.
2.  Cardiac enlargement.

## 2018-05-31 ENCOUNTER — Emergency Department (HOSPITAL_COMMUNITY)
Admission: EM | Admit: 2018-05-31 | Discharge: 2018-06-01 | Disposition: A | Discharge status: Home / Self Care | Payer: Self-pay | Attending: Emergency Medicine | Admitting: Emergency Medicine

## 2018-05-31 ENCOUNTER — Other Ambulatory Visit: Payer: Self-pay

## 2018-05-31 ENCOUNTER — Encounter (HOSPITAL_COMMUNITY): Payer: Self-pay | Admitting: Emergency Medicine

## 2018-05-31 DIAGNOSIS — Z79899 Other long term (current) drug therapy: Secondary | ICD-10-CM | POA: Insufficient documentation

## 2018-05-31 DIAGNOSIS — I5030 Unspecified diastolic (congestive) heart failure: Secondary | ICD-10-CM | POA: Insufficient documentation

## 2018-05-31 DIAGNOSIS — Z7984 Long term (current) use of oral hypoglycemic drugs: Secondary | ICD-10-CM | POA: Insufficient documentation

## 2018-05-31 DIAGNOSIS — I110 Hypertensive heart disease with heart failure: Secondary | ICD-10-CM | POA: Insufficient documentation

## 2018-05-31 DIAGNOSIS — I890 Lymphedema, not elsewhere classified: Secondary | ICD-10-CM | POA: Insufficient documentation

## 2018-05-31 DIAGNOSIS — E119 Type 2 diabetes mellitus without complications: Secondary | ICD-10-CM | POA: Insufficient documentation

## 2018-05-31 HISTORY — DX: Lymphedema, not elsewhere classified: I89.0

## 2018-05-31 HISTORY — DX: Polyneuropathy, unspecified: G62.9

## 2018-05-31 HISTORY — DX: Type 2 diabetes mellitus without complications: E11.9

## 2018-05-31 HISTORY — DX: Heart failure, unspecified: I50.9

## 2018-05-31 HISTORY — DX: Cellulitis, unspecified: L03.90

## 2018-05-31 LAB — CBC
HCT: 42.2 % (ref 39.0–52.0)
Hemoglobin: 13.4 g/dL (ref 13.0–17.0)
MCH: 28.9 pg (ref 26.0–34.0)
MCHC: 31.8 g/dL (ref 30.0–36.0)
MCV: 91.1 fL (ref 80.0–100.0)
Platelets: 282 10*3/uL (ref 150–400)
RBC: 4.63 MIL/uL (ref 4.22–5.81)
RDW: 13.9 % (ref 11.5–15.5)
WBC: 9.1 10*3/uL (ref 4.0–10.5)
nRBC: 0 % (ref 0.0–0.2)

## 2018-05-31 LAB — BASIC METABOLIC PANEL
Anion gap: 11 (ref 5–15)
BUN: 10 mg/dL (ref 6–20)
CO2: 25 mmol/L (ref 22–32)
Calcium: 8.7 mg/dL — ABNORMAL LOW (ref 8.9–10.3)
Chloride: 99 mmol/L (ref 98–111)
Creatinine, Ser: 0.83 mg/dL (ref 0.61–1.24)
GFR calc Af Amer: >60 mL/min (ref >60)
GFR calc non Af Amer: >60 mL/min (ref >60)
Glucose, Bld: 248 mg/dL — ABNORMAL HIGH (ref 70–99)
Potassium: 3.4 mmol/L — ABNORMAL LOW (ref 3.5–5.1)
Sodium: 135 mmol/L (ref 135–145)

## 2018-05-31 LAB — BRAIN NATRIURETIC PEPTIDE: B Natriuretic Peptide: 38 pg/mL (ref 0.0–100.0)

## 2018-05-31 MED ORDER — CLINDAMYCIN PHOSPHATE 300 MG/50ML IV SOLN
300.0000 mg | Freq: Once | INTRAVENOUS | Status: DC
  Filled 2018-05-31: qty 50

## 2018-05-31 MED ORDER — POTASSIUM CHLORIDE CRYS ER 20 MEQ PO TBCR
60.0000 meq | EXTENDED_RELEASE_TABLET | Freq: Once | ORAL | Status: AC
Start: 2018-05-31 — End: 2018-06-01
  Administered 2018-06-01: 60 meq via ORAL
  Filled 2018-05-31: qty 3

## 2018-05-31 MED ORDER — FUROSEMIDE 10 MG/ML IJ SOLN
60.0000 mg | Freq: Once | INTRAMUSCULAR | Status: DC
  Filled 2018-05-31: qty 6

## 2018-05-31 NOTE — ED Triage Notes (Signed)
Swelling and redness to bilateral lower extremities.  Pt has hx of this but states the redness and swelling has gotten worse past couple of days.

## 2018-06-01 MED ORDER — FUROSEMIDE 40 MG PO TABS
60.0000 mg | ORAL_TABLET | Freq: Once | ORAL | Status: AC
  Administered 2018-06-01: 60 mg via ORAL
  Filled 2018-06-01: qty 2

## 2018-06-01 MED ORDER — POTASSIUM CHLORIDE ER 10 MEQ PO TBCR: 10.0000 meq | EXTENDED_RELEASE_TABLET | Freq: Two times a day (BID) | ORAL | 0 refills | Status: AC

## 2018-06-01 MED ORDER — CLINDAMYCIN HCL 150 MG PO CAPS
300.0000 mg | ORAL_CAPSULE | Freq: Once | ORAL | Status: AC
  Administered 2018-06-01: 300 mg via ORAL
  Filled 2018-06-01: qty 2

## 2018-06-01 MED ORDER — FUROSEMIDE 20 MG PO TABS: 20.0000 mg | ORAL_TABLET | Freq: Two times a day (BID) | ORAL | 0 refills | Status: AC

## 2018-06-01 MED ORDER — CLINDAMYCIN HCL 300 MG PO CAPS: 300.0000 mg | ORAL_CAPSULE | Freq: Three times a day (TID) | ORAL | 0 refills | Status: AC

## 2018-06-01 NOTE — ED Notes (Signed)
IV access attempted with no success- Dr Juleen China aware.

## 2018-06-04 NOTE — ED Provider Notes (Signed)
[facility] Provider Note   CSN: [id number] Arrival date & time: 05/31/18  1927    History   Chief Complaint Chief Complaint  Patient presents with  . Leg Swelling    HPI Juan Freeman is a 53 y.o. male.   HPI   53yM with pain/swelling in b/l LE. Hx of severe lymphedema. Progressively worse over the past several weeks. 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 No respiratory complaints. No orthopnea. No fever or chills.   Past Medical History:  Diagnosis Date  . Cellulitis   . CHF (congestive heart failure) (HCC)   . Diabetes mellitus without complication (HCC)   . Hypertension   . Lymphedema   . Neuropathy     Patient Active Problem List   Diagnosis Date Noted  . Acute renal failure (HCC) 07/30/2012  . Pedal edema 07/30/2012  . Diastolic dysfunction 07/30/2012  . Cellulitis 07/24/2012  . Hypertension 07/24/2012  . Obesity 07/24/2012  . Hyperglycemia 07/24/2012    History reviewed. No pertinent surgical history.      Home Medications    Prior to Admission medications   Medication Sig Start Date End Date Taking? Authorizing Provider  amLODipine (NORVASC) 10 MG tablet Take 1 tablet (10 mg total) by mouth daily. 08/06/12   Erick BlinksMemon, Jehanzeb, MD  citalopram (CELEXA) 20 MG tablet Take 1 tablet (20 mg total) by mouth daily. 08/06/12   Erick BlinksMemon, Jehanzeb, MD  clindamycin (CLEOCIN) 300 MG capsule Take 1 capsule (300 mg total) by mouth 4 (four) times daily. 08/06/12   Erick BlinksMemon, Jehanzeb, MD  clindamycin (CLEOCIN) 300 MG capsule Take 1 capsule (300 mg total) by mouth 3 (three) times daily. 06/01/18   Raeford RazorKohut, Jentry Mcqueary, MD  furosemide (LASIX) 20 MG tablet Take 1 tablet (20 mg total) by mouth 2 (two) times daily. 06/01/18   Raeford RazorKohut, Suzzanne Brunkhorst, MD  glipiZIDE (GLUCOTROL) 5 MG tablet Take 1 tablet (5 mg total) by mouth daily before breakfast. 08/06/12   Erick BlinksMemon, Jehanzeb, MD  potassium chloride (K-DUR) 10 MEQ tablet Take 1 tablet (10 mEq total) by  mouth 2 (two) times daily. 06/01/18   Raeford RazorKohut, Arcelia Pals, MD    Family History Family History  Problem Relation Age of Onset  . Diabetes Mother   . Cancer Father     Social History Social History   Tobacco Use  . Smoking status: Never Smoker  . Smokeless tobacco: Never Used  Substance Use Topics  . Alcohol use: No  . Drug use: No     Allergies   Patient has no known allergies.   Review of Systems Review of Systems  All systems reviewed and negative, other than as noted in HPI.  Physical Exam Updated Vital Signs BP (!) 152/92 (BP Location: Left Arm)   Pulse 94   Temp 98.5 F (36.9 C) (Oral)   Resp 19   Ht 5\' 8"  (1.727 m)   Wt 113.4 kg   SpO2 98%   BMI 38.01 kg/m   Physical Exam Vitals signs and nursing note reviewed.  Constitutional:      General: He is not in acute distress.    Appearance: He is well-developed.  HENT:     Head: Normocephalic and atraumatic.  Eyes:     General:        Right eye: No discharge.        Left eye: No discharge.     Conjunctiva/sclera: Conjunctivae normal.  Neck:     Musculoskeletal: Neck  supple.  Cardiovascular:     Rate and Rhythm: Normal rate and regular rhythm.     Heart sounds: Normal heart sounds. No murmur. No friction rub. No gallop.   Pulmonary:     Effort: Pulmonary effort is normal. No respiratory distress.     Breath sounds: Normal breath sounds.  Abdominal:     General: There is no distension.     Palpations: Abdomen is soft.     Tenderness: There is no abdominal tenderness.  Musculoskeletal:        General: No tenderness.     Right lower leg: Edema present.     Left lower leg: Edema present.     Comments: Very severe edema b/l LE. Chronic appearing skin changes. So extensive though that hard to r/o superimposed cellulitis.   Skin:    General: Skin is warm and dry.  Neurological:     Mental Status: He is alert.  Psychiatric:        Behavior: Behavior normal.        Thought Content: Thought content  normal.      ED Treatments / Results  Labs (all labs ordered are listed, but only abnormal results are displayed) Labs Reviewed  BASIC METABOLIC PANEL - Abnormal; Notable for the following components:      Result Value   Potassium 3.4 (*)    Glucose, Bld 248 (*)    Calcium 8.7 (*)    All other components within normal limits  CBC  BRAIN NATRIURETIC PEPTIDE    EKG None  Radiology No results found.  Procedures Procedures (including critical care time)  Medications Ordered in ED Medications  potassium chloride SA (K-DUR) CR tablet 60 mEq (60 mEq Oral Given 06/01/18 0015)  furosemide (LASIX) tablet 60 mg (60 mg Oral Given 06/01/18 0017)  clindamycin (CLEOCIN) capsule 300 mg (300 mg Oral Given 06/01/18 0016)     Initial Impression / Assessment and Plan / ED Course  I have reviewed the triage vital signs and the nursing notes.  Pertinent labs & imaging results that were available during my care of the patient were reviewed by me and considered in my medical decision making (see chart for details).    Severe lymphedema. Lasix. Abx for possible cellulitis but probably less likely given b/l changes. Advised to try and keep elevated the best he can. Return precautions discussed. outpt FU otherwise.   Final Clinical Impressions(s) / ED Diagnoses   Final diagnoses:  Lymphedema    ED Discharge Orders         Ordered    furosemide (LASIX) 20 MG tablet  2 times daily     06/01/18 0008    potassium chloride (K-DUR) 10 MEQ tablet  2 times daily     06/01/18 0008    clindamycin (CLEOCIN) 300 MG capsule  3 times daily     06/01/18 0008           Raeford Razor, MD 06/05/18 0710

## 2021-05-19 LAB — BASIC METABOLIC PANEL
BUN: 14 (ref 4–21)
Creatinine: 1.7 — AB (ref 0.6–1.3)
Glucose: 668

## 2021-05-19 LAB — HEMOGLOBIN A1C: Hemoglobin A1C: 13.9

## 2021-05-19 LAB — COMPREHENSIVE METABOLIC PANEL: eGFR: 46

## 2021-06-22 ENCOUNTER — Encounter: Payer: Self-pay | Admitting: Nurse Practitioner

## 2021-06-22 NOTE — Patient Instructions (Signed)

## 2021-06-23 NOTE — Progress Notes (Signed)
Erroneous encounter- patient no showed consultation

## 2023-05-20 DIAGNOSIS — I5032 Chronic diastolic (congestive) heart failure: Secondary | ICD-10-CM | POA: Diagnosis not present

## 2023-05-20 DIAGNOSIS — E1169 Type 2 diabetes mellitus with other specified complication: Secondary | ICD-10-CM | POA: Diagnosis not present

## 2023-05-20 DIAGNOSIS — I10 Essential (primary) hypertension: Secondary | ICD-10-CM | POA: Diagnosis not present

## 2023-05-20 DIAGNOSIS — M86171 Other acute osteomyelitis, right ankle and foot: Secondary | ICD-10-CM | POA: Diagnosis not present

## 2023-05-21 DIAGNOSIS — I10 Essential (primary) hypertension: Secondary | ICD-10-CM | POA: Diagnosis not present

## 2023-05-21 DIAGNOSIS — E1169 Type 2 diabetes mellitus with other specified complication: Secondary | ICD-10-CM | POA: Diagnosis not present

## 2023-05-21 DIAGNOSIS — M86171 Other acute osteomyelitis, right ankle and foot: Secondary | ICD-10-CM | POA: Diagnosis not present

## 2023-05-21 DIAGNOSIS — I5032 Chronic diastolic (congestive) heart failure: Secondary | ICD-10-CM | POA: Diagnosis not present

## 2023-05-22 DIAGNOSIS — I10 Essential (primary) hypertension: Secondary | ICD-10-CM | POA: Diagnosis not present

## 2023-05-22 DIAGNOSIS — E1169 Type 2 diabetes mellitus with other specified complication: Secondary | ICD-10-CM | POA: Diagnosis not present

## 2023-05-22 DIAGNOSIS — M86171 Other acute osteomyelitis, right ankle and foot: Secondary | ICD-10-CM | POA: Diagnosis not present

## 2023-05-22 DIAGNOSIS — I5032 Chronic diastolic (congestive) heart failure: Secondary | ICD-10-CM | POA: Diagnosis not present

## 2023-05-23 DIAGNOSIS — I10 Essential (primary) hypertension: Secondary | ICD-10-CM | POA: Diagnosis not present

## 2023-05-23 DIAGNOSIS — M86171 Other acute osteomyelitis, right ankle and foot: Secondary | ICD-10-CM | POA: Diagnosis not present

## 2023-05-23 DIAGNOSIS — E1169 Type 2 diabetes mellitus with other specified complication: Secondary | ICD-10-CM | POA: Diagnosis not present

## 2023-05-23 DIAGNOSIS — I5032 Chronic diastolic (congestive) heart failure: Secondary | ICD-10-CM | POA: Diagnosis not present

## 2023-05-24 DIAGNOSIS — I10 Essential (primary) hypertension: Secondary | ICD-10-CM | POA: Diagnosis not present

## 2023-05-24 DIAGNOSIS — I5032 Chronic diastolic (congestive) heart failure: Secondary | ICD-10-CM | POA: Diagnosis not present

## 2023-05-24 DIAGNOSIS — M86171 Other acute osteomyelitis, right ankle and foot: Secondary | ICD-10-CM | POA: Diagnosis not present

## 2023-05-24 DIAGNOSIS — E1169 Type 2 diabetes mellitus with other specified complication: Secondary | ICD-10-CM | POA: Diagnosis not present

## 2023-05-25 DIAGNOSIS — I5032 Chronic diastolic (congestive) heart failure: Secondary | ICD-10-CM | POA: Diagnosis not present

## 2023-05-25 DIAGNOSIS — E1169 Type 2 diabetes mellitus with other specified complication: Secondary | ICD-10-CM | POA: Diagnosis not present

## 2023-05-25 DIAGNOSIS — M86171 Other acute osteomyelitis, right ankle and foot: Secondary | ICD-10-CM | POA: Diagnosis not present

## 2023-05-25 DIAGNOSIS — I10 Essential (primary) hypertension: Secondary | ICD-10-CM | POA: Diagnosis not present

## 2023-05-26 DIAGNOSIS — M86171 Other acute osteomyelitis, right ankle and foot: Secondary | ICD-10-CM | POA: Diagnosis not present

## 2023-05-26 DIAGNOSIS — E1169 Type 2 diabetes mellitus with other specified complication: Secondary | ICD-10-CM | POA: Diagnosis not present

## 2023-05-26 DIAGNOSIS — I10 Essential (primary) hypertension: Secondary | ICD-10-CM | POA: Diagnosis not present

## 2023-05-26 DIAGNOSIS — I5032 Chronic diastolic (congestive) heart failure: Secondary | ICD-10-CM | POA: Diagnosis not present

## 2023-05-27 DIAGNOSIS — E1169 Type 2 diabetes mellitus with other specified complication: Secondary | ICD-10-CM | POA: Diagnosis not present

## 2023-05-27 DIAGNOSIS — I5032 Chronic diastolic (congestive) heart failure: Secondary | ICD-10-CM | POA: Diagnosis not present

## 2023-05-27 DIAGNOSIS — I10 Essential (primary) hypertension: Secondary | ICD-10-CM | POA: Diagnosis not present

## 2023-05-27 DIAGNOSIS — M86171 Other acute osteomyelitis, right ankle and foot: Secondary | ICD-10-CM | POA: Diagnosis not present

## 2023-05-28 DIAGNOSIS — I10 Essential (primary) hypertension: Secondary | ICD-10-CM | POA: Diagnosis not present

## 2023-05-28 DIAGNOSIS — E1169 Type 2 diabetes mellitus with other specified complication: Secondary | ICD-10-CM | POA: Diagnosis not present

## 2023-05-28 DIAGNOSIS — I5032 Chronic diastolic (congestive) heart failure: Secondary | ICD-10-CM | POA: Diagnosis not present

## 2023-05-28 DIAGNOSIS — M86171 Other acute osteomyelitis, right ankle and foot: Secondary | ICD-10-CM | POA: Diagnosis not present

## 2023-05-29 DIAGNOSIS — E1169 Type 2 diabetes mellitus with other specified complication: Secondary | ICD-10-CM | POA: Diagnosis not present

## 2023-05-29 DIAGNOSIS — I5032 Chronic diastolic (congestive) heart failure: Secondary | ICD-10-CM | POA: Diagnosis not present

## 2023-05-29 DIAGNOSIS — M86171 Other acute osteomyelitis, right ankle and foot: Secondary | ICD-10-CM | POA: Diagnosis not present

## 2023-05-29 DIAGNOSIS — I10 Essential (primary) hypertension: Secondary | ICD-10-CM | POA: Diagnosis not present

## 2023-05-30 DIAGNOSIS — E1169 Type 2 diabetes mellitus with other specified complication: Secondary | ICD-10-CM | POA: Diagnosis not present

## 2023-05-30 DIAGNOSIS — M86171 Other acute osteomyelitis, right ankle and foot: Secondary | ICD-10-CM | POA: Diagnosis not present

## 2023-05-30 DIAGNOSIS — I10 Essential (primary) hypertension: Secondary | ICD-10-CM | POA: Diagnosis not present

## 2023-05-30 DIAGNOSIS — I5032 Chronic diastolic (congestive) heart failure: Secondary | ICD-10-CM | POA: Diagnosis not present

## 2023-05-31 DIAGNOSIS — E1169 Type 2 diabetes mellitus with other specified complication: Secondary | ICD-10-CM | POA: Diagnosis not present

## 2023-05-31 DIAGNOSIS — I10 Essential (primary) hypertension: Secondary | ICD-10-CM | POA: Diagnosis not present

## 2023-05-31 DIAGNOSIS — M86171 Other acute osteomyelitis, right ankle and foot: Secondary | ICD-10-CM | POA: Diagnosis not present

## 2023-05-31 DIAGNOSIS — I5032 Chronic diastolic (congestive) heart failure: Secondary | ICD-10-CM | POA: Diagnosis not present

## 2023-06-01 DIAGNOSIS — I5032 Chronic diastolic (congestive) heart failure: Secondary | ICD-10-CM | POA: Diagnosis not present

## 2023-06-01 DIAGNOSIS — I10 Essential (primary) hypertension: Secondary | ICD-10-CM | POA: Diagnosis not present

## 2023-06-01 DIAGNOSIS — E1169 Type 2 diabetes mellitus with other specified complication: Secondary | ICD-10-CM | POA: Diagnosis not present

## 2023-06-01 DIAGNOSIS — M86171 Other acute osteomyelitis, right ankle and foot: Secondary | ICD-10-CM | POA: Diagnosis not present

## 2023-06-02 DIAGNOSIS — I10 Essential (primary) hypertension: Secondary | ICD-10-CM | POA: Diagnosis not present

## 2023-06-02 DIAGNOSIS — E1169 Type 2 diabetes mellitus with other specified complication: Secondary | ICD-10-CM | POA: Diagnosis not present

## 2023-06-02 DIAGNOSIS — I5032 Chronic diastolic (congestive) heart failure: Secondary | ICD-10-CM | POA: Diagnosis not present

## 2023-06-02 DIAGNOSIS — M86171 Other acute osteomyelitis, right ankle and foot: Secondary | ICD-10-CM | POA: Diagnosis not present

## 2023-06-03 DIAGNOSIS — M86171 Other acute osteomyelitis, right ankle and foot: Secondary | ICD-10-CM | POA: Diagnosis not present

## 2023-06-03 DIAGNOSIS — I5032 Chronic diastolic (congestive) heart failure: Secondary | ICD-10-CM | POA: Diagnosis not present

## 2023-06-03 DIAGNOSIS — E1169 Type 2 diabetes mellitus with other specified complication: Secondary | ICD-10-CM | POA: Diagnosis not present

## 2023-06-03 DIAGNOSIS — I10 Essential (primary) hypertension: Secondary | ICD-10-CM | POA: Diagnosis not present

## 2023-06-04 DIAGNOSIS — E1169 Type 2 diabetes mellitus with other specified complication: Secondary | ICD-10-CM | POA: Diagnosis not present

## 2023-06-04 DIAGNOSIS — M86171 Other acute osteomyelitis, right ankle and foot: Secondary | ICD-10-CM | POA: Diagnosis not present

## 2023-06-04 DIAGNOSIS — I10 Essential (primary) hypertension: Secondary | ICD-10-CM | POA: Diagnosis not present

## 2023-06-04 DIAGNOSIS — I5032 Chronic diastolic (congestive) heart failure: Secondary | ICD-10-CM | POA: Diagnosis not present

## 2023-06-05 DIAGNOSIS — M86171 Other acute osteomyelitis, right ankle and foot: Secondary | ICD-10-CM | POA: Diagnosis not present

## 2023-06-05 DIAGNOSIS — I5032 Chronic diastolic (congestive) heart failure: Secondary | ICD-10-CM | POA: Diagnosis not present

## 2023-06-05 DIAGNOSIS — I10 Essential (primary) hypertension: Secondary | ICD-10-CM | POA: Diagnosis not present

## 2023-06-05 DIAGNOSIS — E1169 Type 2 diabetes mellitus with other specified complication: Secondary | ICD-10-CM | POA: Diagnosis not present

## 2023-06-06 DIAGNOSIS — E1169 Type 2 diabetes mellitus with other specified complication: Secondary | ICD-10-CM | POA: Diagnosis not present

## 2023-06-06 DIAGNOSIS — I5032 Chronic diastolic (congestive) heart failure: Secondary | ICD-10-CM | POA: Diagnosis not present

## 2023-06-06 DIAGNOSIS — M86171 Other acute osteomyelitis, right ankle and foot: Secondary | ICD-10-CM | POA: Diagnosis not present

## 2023-06-06 DIAGNOSIS — I10 Essential (primary) hypertension: Secondary | ICD-10-CM | POA: Diagnosis not present

## 2023-06-07 DIAGNOSIS — I10 Essential (primary) hypertension: Secondary | ICD-10-CM | POA: Diagnosis not present

## 2023-06-07 DIAGNOSIS — M86171 Other acute osteomyelitis, right ankle and foot: Secondary | ICD-10-CM | POA: Diagnosis not present

## 2023-06-07 DIAGNOSIS — I5032 Chronic diastolic (congestive) heart failure: Secondary | ICD-10-CM | POA: Diagnosis not present

## 2023-06-07 DIAGNOSIS — E1169 Type 2 diabetes mellitus with other specified complication: Secondary | ICD-10-CM | POA: Diagnosis not present

## 2023-06-08 DIAGNOSIS — M86171 Other acute osteomyelitis, right ankle and foot: Secondary | ICD-10-CM | POA: Diagnosis not present

## 2023-06-08 DIAGNOSIS — E1169 Type 2 diabetes mellitus with other specified complication: Secondary | ICD-10-CM | POA: Diagnosis not present

## 2023-06-08 DIAGNOSIS — I10 Essential (primary) hypertension: Secondary | ICD-10-CM | POA: Diagnosis not present

## 2023-06-08 DIAGNOSIS — I5032 Chronic diastolic (congestive) heart failure: Secondary | ICD-10-CM | POA: Diagnosis not present

## 2023-06-09 DIAGNOSIS — E1169 Type 2 diabetes mellitus with other specified complication: Secondary | ICD-10-CM | POA: Diagnosis not present

## 2023-06-09 DIAGNOSIS — M86171 Other acute osteomyelitis, right ankle and foot: Secondary | ICD-10-CM | POA: Diagnosis not present

## 2023-06-09 DIAGNOSIS — I5032 Chronic diastolic (congestive) heart failure: Secondary | ICD-10-CM | POA: Diagnosis not present

## 2023-06-09 DIAGNOSIS — I10 Essential (primary) hypertension: Secondary | ICD-10-CM | POA: Diagnosis not present

## 2023-06-13 ENCOUNTER — Other Ambulatory Visit (HOSPITAL_COMMUNITY)
Admission: RE | Admit: 2023-06-13 | Discharge: 2023-06-13 | Disposition: A | Discharge status: Home / Self Care | Source: Skilled Nursing Facility

## 2023-06-13 DIAGNOSIS — M869 Osteomyelitis, unspecified: Secondary | ICD-10-CM | POA: Diagnosis present

## 2023-06-13 LAB — COMPREHENSIVE METABOLIC PANEL WITH GFR
ALT: 21 U/L (ref 0–44)
AST: 21 U/L (ref 15–41)
Albumin: 3 g/dL — ABNORMAL LOW (ref 3.5–5.0)
Alkaline Phosphatase: 65 U/L (ref 38–126)
Anion gap: 11 (ref 5–15)
BUN: 16 mg/dL (ref 6–20)
CO2: 22 mmol/L (ref 22–32)
Calcium: 9.4 mg/dL (ref 8.9–10.3)
Chloride: 105 mmol/L (ref 98–111)
Creatinine, Ser: 0.65 mg/dL (ref 0.61–1.24)
GFR, Estimated: >60 mL/min (ref >60)
Glucose, Bld: 99 mg/dL (ref 70–99)
Potassium: 3.6 mmol/L (ref 3.5–5.1)
Sodium: 138 mmol/L (ref 135–145)
Total Bilirubin: 1 mg/dL (ref 0.0–1.2)
Total Protein: 7 g/dL (ref 6.5–8.1)

## 2023-06-13 LAB — CBC WITH DIFFERENTIAL/PLATELET
Abs Immature Granulocytes: 0.02 10*3/uL (ref 0.00–0.07)
Basophils Absolute: 0.1 10*3/uL (ref 0.0–0.1)
Basophils Relative: 1 %
Eosinophils Absolute: 0.4 10*3/uL (ref 0.0–0.5)
Eosinophils Relative: 7 %
HCT: 41.7 % (ref 39.0–52.0)
Hemoglobin: 13.8 g/dL (ref 13.0–17.0)
Immature Granulocytes: 0 %
Lymphocytes Relative: 33 %
Lymphs Abs: 1.7 10*3/uL (ref 0.7–4.0)
MCH: 29.4 pg (ref 26.0–34.0)
MCHC: 33.1 g/dL (ref 30.0–36.0)
MCV: 88.9 fL (ref 80.0–100.0)
Monocytes Absolute: 0.5 10*3/uL (ref 0.1–1.0)
Monocytes Relative: 10 %
Neutro Abs: 2.5 10*3/uL (ref 1.7–7.7)
Neutrophils Relative %: 49 %
Platelets: 199 10*3/uL (ref 150–400)
RBC: 4.69 MIL/uL (ref 4.22–5.81)
RDW: 16.9 % — ABNORMAL HIGH (ref 11.5–15.5)
WBC: 5.1 10*3/uL (ref 4.0–10.5)
nRBC: 0 % (ref 0.0–0.2)

## 2023-06-13 LAB — VANCOMYCIN, TROUGH: Vancomycin Tr: 17 ug/mL (ref 15–20)

## 2023-06-20 ENCOUNTER — Other Ambulatory Visit (HOSPITAL_COMMUNITY)
Admission: RE | Admit: 2023-06-20 | Discharge: 2023-06-20 | Disposition: A | Discharge status: Home / Self Care | Source: Other Acute Inpatient Hospital

## 2023-06-20 DIAGNOSIS — M86171 Other acute osteomyelitis, right ankle and foot: Secondary | ICD-10-CM | POA: Diagnosis present

## 2023-06-20 LAB — CBC WITH DIFFERENTIAL/PLATELET
Abs Immature Granulocytes: 0.01 10*3/uL (ref 0.00–0.07)
Basophils Absolute: 0 10*3/uL (ref 0.0–0.1)
Basophils Relative: 0 %
Eosinophils Absolute: 0.4 10*3/uL (ref 0.0–0.5)
Eosinophils Relative: 6 %
HCT: 39.2 % (ref 39.0–52.0)
Hemoglobin: 13.4 g/dL (ref 13.0–17.0)
Immature Granulocytes: 0 %
Lymphocytes Relative: 39 %
Lymphs Abs: 2.2 10*3/uL (ref 0.7–4.0)
MCH: 30.1 pg (ref 26.0–34.0)
MCHC: 34.2 g/dL (ref 30.0–36.0)
MCV: 88.1 fL (ref 80.0–100.0)
Monocytes Absolute: 0.4 10*3/uL (ref 0.1–1.0)
Monocytes Relative: 7 %
Neutro Abs: 2.6 10*3/uL (ref 1.7–7.7)
Neutrophils Relative %: 48 %
Platelets: 187 10*3/uL (ref 150–400)
RBC: 4.45 MIL/uL (ref 4.22–5.81)
RDW: 15.8 % — ABNORMAL HIGH (ref 11.5–15.5)
WBC: 5.6 10*3/uL (ref 4.0–10.5)
nRBC: 0 % (ref 0.0–0.2)

## 2023-06-20 LAB — COMPREHENSIVE METABOLIC PANEL WITH GFR
ALT: 22 U/L (ref 0–44)
AST: 19 U/L (ref 15–41)
Albumin: 3 g/dL — ABNORMAL LOW (ref 3.5–5.0)
Alkaline Phosphatase: 57 U/L (ref 38–126)
Anion gap: 9 (ref 5–15)
BUN: 21 mg/dL — ABNORMAL HIGH (ref 6–20)
CO2: 24 mmol/L (ref 22–32)
Calcium: 9 mg/dL (ref 8.9–10.3)
Chloride: 104 mmol/L (ref 98–111)
Creatinine, Ser: 0.63 mg/dL (ref 0.61–1.24)
GFR, Estimated: >60 mL/min (ref >60)
Glucose, Bld: 121 mg/dL — ABNORMAL HIGH (ref 70–99)
Potassium: 3.2 mmol/L — ABNORMAL LOW (ref 3.5–5.1)
Sodium: 137 mmol/L (ref 135–145)
Total Bilirubin: 0.6 mg/dL (ref 0.0–1.2)
Total Protein: 6.3 g/dL — ABNORMAL LOW (ref 6.5–8.1)

## 2023-06-20 LAB — VANCOMYCIN, TROUGH: Vancomycin Tr: 15 ug/mL (ref 15–20)
